# Patient Record
Sex: Female | Born: 1985 | Hispanic: Yes | Marital: Single | Smoking: Never smoker
Health system: Southern US, Community
[De-identification: ages and names within clinical notes are randomized; demographics above are authoritative.]

## PROBLEM LIST (undated history)

## (undated) DIAGNOSIS — B999 Unspecified infectious disease: Secondary | ICD-10-CM

## (undated) HISTORY — PX: NO PAST SURGERIES: SHX2092

---

## 2008-11-28 ENCOUNTER — Inpatient Hospital Stay: Payer: Self-pay | Admitting: Obstetrics and Gynecology

## 2014-06-30 ENCOUNTER — Ambulatory Visit: Payer: Self-pay | Admitting: Advanced Practice Midwife

## 2014-07-21 ENCOUNTER — Ambulatory Visit: Payer: Self-pay | Admitting: Family Medicine

## 2014-11-05 ENCOUNTER — Inpatient Hospital Stay: Payer: Self-pay

## 2015-01-27 NOTE — H&P (Signed)
L&D Evaluation:  History:  HPI 29 y/o G2P1001 @ 38+wks EDC 11/16/14 arrived with c/o regular contractions. Denies leaking fluid, small bloody show, baby is active. Well pregnancy care @ [facility], GBS negative.   Presents with contractions   Patient's Medical History No Chronic Illness   Patient's Surgical History none   Medications Pre Natal Vitamins   Allergies NKDA   Social History none   Family History Non-Contributory   ROS:  ROS All systems were reviewed.  HEENT, CNS, GI, GU, Respiratory, CV, Renal and Musculoskeletal systems were found to be normal.   Exam:  Vital Signs stable   Urine Protein not completed   General no apparent distress   Mental Status clear   Chest clear   Heart normal sinus rhythm   Abdomen gravid, non-tender   Estimated Fetal Weight Average for gestational age   Fetal Position vtx   Fundal Height term   Back no CVAT   Edema no edema   Reflexes 1+   Clonus negative   Pelvic no external lesions, 5cm upon admission progressed to 7cm after AROM clear fluid   Mebranes Ruptured, AROM   Description clear   FHT normal rate with no decels, baseline 120's baseline avg variability with accels   Fetal Heart Rate 136   Ucx regular   Ucx Frequency 23 min   Length of each Contraction 60 seconds   Skin dry   Lymph no lymphadenopathy   Impression:  Impression active labor   Plan:  Plan monitor contractions and for cervical change   Comments Knows what to expect 2nd baby. Breathing well thru uc's. Stadol helpful, declines epidural. Family at bedside, supportive.   Electronic Signatures: Albertina ParrLugiano, Hortensia Duffin B (CNM)  (Signed 17-Feb-16 20:02)  Authored: L&D Evaluation   Last Updated: 17-Feb-16 20:02 by Albertina ParrLugiano, Phoebie Shad B (CNM)

## 2019-03-05 ENCOUNTER — Encounter: Payer: Self-pay | Admitting: Advanced Practice Midwife

## 2019-03-05 DIAGNOSIS — U071 COVID-19: Secondary | ICD-10-CM | POA: Insufficient documentation

## 2019-04-11 LAB — OB RESULTS CONSOLE HEPATITIS B SURFACE ANTIGEN: Hepatitis B Surface Ag: NEGATIVE

## 2019-04-11 LAB — OB RESULTS CONSOLE RUBELLA ANTIBODY, IGM: Rubella: IMMUNE

## 2019-04-11 LAB — OB RESULTS CONSOLE HIV ANTIBODY (ROUTINE TESTING): HIV: NONREACTIVE

## 2019-04-25 ENCOUNTER — Ambulatory Visit (HOSPITAL_COMMUNITY): Payer: Self-pay

## 2019-07-09 LAB — OB RESULTS CONSOLE GBS: GBS: POSITIVE

## 2019-09-04 ENCOUNTER — Other Ambulatory Visit: Payer: Self-pay

## 2019-09-04 ENCOUNTER — Inpatient Hospital Stay (HOSPITAL_COMMUNITY)
Admission: AD | Admit: 2019-09-04 | Discharge: 2019-09-04 | Disposition: A | Discharge status: Home / Self Care | Payer: Self-pay | Attending: Obstetrics & Gynecology | Admitting: Obstetrics & Gynecology

## 2019-09-04 ENCOUNTER — Encounter (HOSPITAL_COMMUNITY): Payer: Self-pay | Admitting: *Deleted

## 2019-09-04 DIAGNOSIS — Z3A32 32 weeks gestation of pregnancy: Secondary | ICD-10-CM | POA: Insufficient documentation

## 2019-09-04 DIAGNOSIS — Z841 Family history of disorders of kidney and ureter: Secondary | ICD-10-CM | POA: Insufficient documentation

## 2019-09-04 DIAGNOSIS — O26893 Other specified pregnancy related conditions, third trimester: Secondary | ICD-10-CM

## 2019-09-04 DIAGNOSIS — B029 Zoster without complications: Secondary | ICD-10-CM | POA: Insufficient documentation

## 2019-09-04 DIAGNOSIS — Z833 Family history of diabetes mellitus: Secondary | ICD-10-CM | POA: Insufficient documentation

## 2019-09-04 DIAGNOSIS — O98513 Other viral diseases complicating pregnancy, third trimester: Secondary | ICD-10-CM | POA: Insufficient documentation

## 2019-09-04 HISTORY — DX: Unspecified infectious disease: B99.9

## 2019-09-04 MED ORDER — VALACYCLOVIR HCL 1 G PO TABS: 1000.0000 mg | ORAL_TABLET | Freq: Three times a day (TID) | ORAL | 0 refills | Status: AC

## 2019-09-04 NOTE — MAU Provider Note (Signed)
Chief Complaint: Rash   First Provider Initiated Contact with Patient 09/04/19 1208     *Spanish interpreter at bedside for this visit*  SUBJECTIVE HPI: Amanda Preston is a 33 y.o. G3P2002 at [redacted]w[redacted]d who presents to Maternity Admissions reporting rash.  Symptoms started a week ago. Reports painful rash on left breast, left axilla, and and upper left back. Rash does not itch. Describes as burning. States she doesn't recall having chicken pox as a child and unsure if she ever had the vaccine. Goes to [facility] for prenatal care. Was told to come here for evaluation because there aren't isolation rooms at the office.  No OB complaints.   Location: breast, axilla, back Quality: burning Severity: 8/10 on pain scale Duration: 1 week Timing: constant Modifying factors: worse with touch Associated signs and symptoms: rash  Past Medical History:  Diagnosis Date  . Infection    UTI   OB History  Gravida Para Term Preterm AB Living  3 2 2    0 2  SAB TAB Ectopic Multiple Live Births          2    # Outcome Date GA Lbr Len/2nd Weight Sex Delivery Anes PTL Lv  3 Current           2 Term     F Vag-Spont   LIV  1 Term     F Vag-Spont   LIV   Past Surgical History:  Procedure Laterality Date  . NO PAST SURGERIES     Social History   Socioeconomic History  . Marital status: Single    Spouse name: Not on file  . Number of children: Not on file  . Years of education: Not on file  . Highest education level: Not on file  Occupational History  . Not on file  Tobacco Use  . Smoking status: Never Smoker  . Smokeless tobacco: Never Used  Substance and Sexual Activity  . Alcohol use: Not Currently    Comment: rare  . Drug use: Never  . Sexual activity: Not Currently  Other Topics Concern  . Not on file  Social History Narrative  . Not on file   Social Determinants of Health   Financial Resource Strain:   . Difficulty of Paying Living Expenses: Not on file  Food Insecurity:   .  Worried About Charity fundraiser in the Last Year: Not on file  . Ran Out of Food in the Last Year: Not on file  Transportation Needs:   . Lack of Transportation (Medical): Not on file  . Lack of Transportation (Non-Medical): Not on file  Physical Activity:   . Days of Exercise per Week: Not on file  . Minutes of Exercise per Session: Not on file  Stress:   . Feeling of Stress : Not on file  Social Connections:   . Frequency of Communication with Friends and Family: Not on file  . Frequency of Social Gatherings with Friends and Family: Not on file  . Attends Religious Services: Not on file  . Active Member of Clubs or Organizations: Not on file  . Attends Archivist Meetings: Not on file  . Marital Status: Not on file  Intimate Partner Violence:   . Fear of Current or Ex-Partner: Not on file  . Emotionally Abused: Not on file  . Physically Abused: Not on file  . Sexually Abused: Not on file   Family History  Problem Relation Age of Onset  . Kidney disease  Mother   . Diabetes Mother    No current facility-administered medications on file prior to encounter.   No current outpatient medications on file prior to encounter.   No Known Allergies  I have reviewed patient's Past Medical Hx, Surgical Hx, Family Hx, Social Hx, medications and allergies.   Review of Systems  Constitutional: Negative.   Gastrointestinal: Negative.   Genitourinary: Negative.   Skin: Positive for rash.    OBJECTIVE Patient Vitals for the past 24 hrs:  BP Temp Temp src Pulse Resp SpO2  09/04/19 1324 108/61 -- -- 81 -- --  09/04/19 1157 117/60 98.1 F (36.7 C) Oral 80 16 --  09/04/19 1147 (!) 113/58 -- -- 78 -- 100 %   Constitutional: Well-developed, well-nourished female in no acute distress.  Cardiovascular: normal rate & rhythm, no murmur Respiratory: normal rate and effort. Lung sounds clear throughout GI: Abd soft, non-tender, Pos BS x 4. No guarding or rebound tenderness MS:  Extremities nontender, no edema, normal ROM Neurologic: Alert and oriented x 4.  Skin: 3 large groupings of vesicular lesions located on top of left breast, inferior to left axilla, and just over left scapula. Lesions appear crusted over.     LAB RESULTS No results found for this or any previous visit (from the past 24 hour(s)).  IMAGING No results found.  MAU COURSE Orders Placed This Encounter  Procedures  . Airborne and Contact precautions  . Discharge patient   Meds ordered this encounter  Medications  . valACYclovir (VALTREX) 1000 MG tablet    Sig: Take 1 tablet (1,000 mg total) by mouth 3 (three) times daily for 7 days.    Dispense:  21 tablet    Refill:  0    Order Specific Question:   Supervising Provider    Answer:   Adam Phenix [[id number]]    MDM FHT present via doppler & pt has no OB complaints  Lesions consistent with shingles. Discussed treatment with patient.   ASSESSMENT 1. Herpes zoster without complication   2. [redacted] weeks gestation of pregnancy     PLAN Discharge home in stable condition. Discussed reasons to return to MAU Rx valacyclovir 1 gm TID x 7 days Discussed precautions at home  Follow-up Information    Department, [facility] Follow up.   Contact information: [address] [place] [address] [phone]          Allergies as of 09/04/2019   No Known Allergies     Medication List    TAKE these medications   valACYclovir 1000 MG tablet Commonly known as: Valtrex Take 1 tablet (1,000 mg total) by mouth 3 (three) times daily for 7 days.        Judeth Horn, NP 09/04/2019  3:25 PM

## 2019-09-04 NOTE — Discharge Instructions (Signed)
Culebrilla Shingles  La culebrilla, tambin conocida como herpes zster, es una infeccin que causa una erupcin cutnea dolorosa y ampollas llenas de lquido. La causa un virus. La culebrilla solo se Programmer, applications que:  Han tenido varicela.  Han recibido un medicamento para protegerse de la varicela (han sido vacunadas). La culebrilla es poco frecuente en ese grupo. Cules son las causas? El virus de la varicela zster (VVZ) ocasiona la culebrilla. Este es el mismo virus que causa la varicela. Despus de la exposicin al VVZ, el virus permanece en el cuerpo en un estado inactivo (latente). La culebrilla se desarrolla si el virus se reactiva. Esto puede ocurrir muchos aos despus de la primera exposicin (inicial) al VVZ. No se sabe qu causa la reactivacin de este virus. Qu incrementa el riesgo? Las personas que han tenido varicela o han recibido la vacuna contra la varicela, estn en riesgo de tener culebrilla. La infeccin de la culebrilla es ms frecuente en las personas que:  [place] de 60aos de [place].  Tienen debilitado el sistema que combate las enfermedades (sistemainmunitario), por ejemplo, las personas que tienen: ? VIH. ? Sndrome de inmunodeficiencia adquirida (sida). ? Cncer.  Reciben medicamentos que debilitan el sistema inmunitario, como los medicamentos que se indican en caso de trasplantes.  Est experimentando mucho estrs. Cules son los signos o los sntomas? Los sntomas tempranos de esta afeccin incluyen picazn, hormigueo y Engineer, mining en un rea de la piel. Este dolor se puede describir como ardor, punzante o pulstil. Unos das o semanas despus de que comienzan los primeros sntomas, aparece una erupcin cutnea rojiza y dolorosa. La erupcin generalmente aparece en un lado del cuerpo en un patrn de bandas o semejante a una franja. Con el tiempo, la erupcin cutnea se convierte en ampollas llenas de lquido que se rompen, se transforman en costras  y se secan en el trmino de 2 a 3 semanas. En cualquier momento durante la infeccin, usted tambin puede presentar:  Grant Ruts.  Escalofros.  Dolor de [place].  Malestar estomacal. Cmo se diagnostica? Esta afeccin se diagnostica con un examen cutneo. Es posible que se extraigan muestras de piel o lquido de las ampollas antes de Education officer, environmental un diagnstico. Estas muestras se examinan bajo un microscopio o se envan a un laboratorio para su anlisis. Cmo se trata? La erupcin cutnea puede durar varias semanas. No hay una cura especfica para esta afeccin. Probablemente, su mdico le recetar medicamentos para ayudarlo a Human resources officer, recuperarse ms rpido y Physiological scientist a Air cabin crew. Entre los medicamentos se incluyen los siguientes:  Medicamentos antivirales.  Frmacos antiinflamatorios.  Analgsicos.  Medicamentos para Associate Professor (antihistamnicos). Si la zona afectada est en el rostro, se lo puede derivar a un especialista, como un mdico especialista en ojos (oftalmlogo) o en odos, nariz y Advertising copywriter (otorrinolaringlogo) para ayudarlo a Automotive engineer problemas oculares, dolor crnico o discapacidad. Siga estas indicaciones en su casa: Medicamentos  [employer] de venta libre y los recetados solamente como se lo haya indicado el mdico.  Aplique una crema para calmar la picazn o cremas anestsicas en la zona afectada segn las indicaciones del mdico. Para aliviar la picazn y las molestias   Aplique paos hmedos y fros (compresas fras) sobre la zona de la erupcin cutnea o las ampollas siguiendo las indicaciones del mdico.  [place] baos con agua fra pueden brindar alivio. Pruebe agregar bicarbonato de sodio o avena seca en el agua para reducir la picazn. No se bae con agua caliente.  [place] y la erupcin cutnea  Mantenga la zona de la erupcin cutnea cubierta con una venda floja (vendaje). Use ropa holgada para ayudar a  Best boy provocado por el roce con la erupcin.  Mantenga la erupcin y las ampollas limpias lavando la zona con [place] y agua fra segn las indicaciones del mdico.  Verifique la erupcin cutnea todos los das para detectar signos de infeccin. Est atento a los siguientes signos: ? Aumento del enrojecimiento, la hinchazn o Conservation officer, historic buildings. ? Lquido o sangre. ? Calor. ? Pus o mal olor.  No se rasque en la zona de la erupcin ni se toque las ampollas. Para ayudar a evitar rascarse: ? Tenga las uas siempre cortas y limpias. ? Use guantes o mitones mientras duerme, si no puede dejar de rascarse. Instrucciones generales  Haga reposo como se lo haya indicado el mdico.  Concurra a todas las visitas de seguimiento como se lo haya indicado el mdico. Esto es importante.  Lvese las manos frecuentemente con agua y [place]. Use desinfectante para manos si no dispone de [place] y [place]. Al hacerlo, reduce sus probabilidades de contraer una infeccin bacteriana en la piel.  Antes de que se forme una [employer], la infeccin de la culebrilla puede causar varicela en las personas que nunca la tuvieron o nunca se vacunaron contra la varicela. Para impedir que esto ocurra, evite el contacto con [employer], especialmente: ? Los bebs. ? Las Com[employer]. ? Los nios con eczema. ? Las [employer] con trasplantes. ? [employer] con enfermedades crnicas, como cncer o sndrome de inmunodeficiencia adquirida (SIDA). Comunquese con un mdico si:  El dolor no se [place] con los [employer].  El dolor no mejora despus de que se cura la erupcin cutnea.  Tiene signos de infeccin en la zona de la erupcin cutnea, tales como los siguientes: ? Aumento del enrojecimiento, la hinchazn o Conservation officer, historic buildings alrededor de la erupcin. ? Presenta lquido o sangre que supura de la erupcin. ? La zona de la erupcin se siente caliente al tacto. ? Advierte pus o  mal olor que sale de la erupcin. Solicite ayuda de inmediato si:  La erupcin cutnea est en el rostro o en la nariz.  Tiene dolor en el rostro, en la zona de los ojos o presenta prdida de sensibilidad en un lado del rostro.  Tiene dificultad para ver.  Siente dolor o zumbido en los odos.  Presenta prdida del gusto.  La afeccin empeora. Resumen  La culebrilla, tambin conocida como herpes zster, es una infeccin que causa una erupcin cutnea dolorosa y ampollas llenas de lquido.  Esta afeccin se diagnostica con un examen cutneo. Es posible que se extraigan y examinen muestras de [place] ampollas antes de Optometrist un diagnstico.  Mantenga la zona de la erupcin cutnea cubierta con una venda floja (vendaje). Use ropa holgada para ayudar a Best boy provocado por el roce con la erupcin.  Antes de que se forme una [employer], la infeccin de la culebrilla puede causar varicela en las personas que nunca la tuvieron o nunca se vacunaron contra la varicela. Esta informacin no tiene Marine scientist el consejo del mdico. Asegrese de hacerle al mdico cualquier pregunta que tenga. Document Released: 06/15/2005 Document Revised: 07/21/2017 Document Reviewed: 07/21/2017 Elsevier Patient Education  2020 [employer].

## 2019-09-04 NOTE — MAU Note (Signed)
Sent in for evaluation ? Shingles called HD yesterday. Rash noted 6 days ago. Has a burning pain with rash. Red raised areas, patches noted on left breast, under arm and around on back-left side.  Does not cross midline, is above bra line. No drainage noted.

## 2019-09-20 NOTE — L&D Delivery Note (Signed)
OB/GYN Faculty Practice Delivery Note  Amanda Preston is a 34 y.o. [id number] s/p VD at [redacted]w[redacted]d. She was admitted for SOL.   ROM: 3h 53m with clear fluid GBS Status: Positive/-- (10/20 0000) Maximum Maternal Temperature: 98.99F  Labor Progress: . Initial SVE: 4/60/-3. Patient received AROM and Pitocin. She then progressed to complete.   Delivery Date/Time: 2/13 @ 2306 Delivery: Amanda Preston bedside as patient feeling pressure and we then began pushing. Head delivered in LOA position. Tight nuchal cord present and reduced. Shoulder and body delivered in usual fashion. Infant with spontaneous cry, placed on mother's abdomen, dried and stimulated. Cord clamped x 2 after 1-minute delay, and cut by FOB. Cord blood drawn. Placenta delivered spontaneously with gentle cord traction. Fundus firm with massage and Pitocin. Labia, perineum, vagina, and cervix inspected inspected with no lacerations.  Baby Weight: pending  Placenta: Sent to L&D Complications: None Lacerations: None EBL: 55 mL Analgesia: Epidural   Infant: APGAR (1 MIN): 9   APGAR (5 MINS): 9  APGAR (10 MINS):     Amanda Birkenhead, MD [facility] Family Medicine Fellow, [facility] for [facility], [facility] 11/02/2019, 11:17 PM

## 2019-10-29 ENCOUNTER — Telehealth (HOSPITAL_COMMUNITY): Payer: Self-pay | Admitting: *Deleted

## 2019-10-29 NOTE — Telephone Encounter (Signed)
Preadmission screen  

## 2019-10-30 ENCOUNTER — Other Ambulatory Visit: Payer: Self-pay | Admitting: Advanced Practice Midwife

## 2019-11-01 ENCOUNTER — Other Ambulatory Visit (HOSPITAL_COMMUNITY): Payer: Self-pay

## 2019-11-01 ENCOUNTER — Telehealth (HOSPITAL_COMMUNITY): Payer: Self-pay | Admitting: *Deleted

## 2019-11-01 ENCOUNTER — Encounter (HOSPITAL_COMMUNITY): Payer: Self-pay | Admitting: *Deleted

## 2019-11-01 NOTE — Telephone Encounter (Signed)
[id number] interpreter number Preadmission screen

## 2019-11-02 ENCOUNTER — Other Ambulatory Visit: Payer: Self-pay

## 2019-11-02 ENCOUNTER — Encounter (HOSPITAL_COMMUNITY): Payer: Self-pay | Admitting: Obstetrics and Gynecology

## 2019-11-02 ENCOUNTER — Inpatient Hospital Stay (HOSPITAL_COMMUNITY): Payer: Medicaid Other | Admitting: Anesthesiology

## 2019-11-02 ENCOUNTER — Inpatient Hospital Stay (HOSPITAL_COMMUNITY)
Admission: AD | Admit: 2019-11-02 | Discharge: 2019-11-04 | DRG: 807 | Disposition: A | Discharge status: Home / Self Care | Payer: Medicaid Other | Attending: Obstetrics and Gynecology | Admitting: Obstetrics and Gynecology

## 2019-11-02 DIAGNOSIS — O480 Post-term pregnancy: Principal | ICD-10-CM | POA: Diagnosis present

## 2019-11-02 DIAGNOSIS — O99824 Streptococcus B carrier state complicating childbirth: Secondary | ICD-10-CM | POA: Diagnosis present

## 2019-11-02 DIAGNOSIS — Z20822 Contact with and (suspected) exposure to covid-19: Secondary | ICD-10-CM | POA: Diagnosis present

## 2019-11-02 DIAGNOSIS — B951 Streptococcus, group B, as the cause of diseases classified elsewhere: Secondary | ICD-10-CM

## 2019-11-02 DIAGNOSIS — Z3A41 41 weeks gestation of pregnancy: Secondary | ICD-10-CM

## 2019-11-02 LAB — ABO/RH: ABO/RH(D): O POS

## 2019-11-02 LAB — TYPE AND SCREEN
ABO/RH(D): O POS
Antibody Screen: NEGATIVE

## 2019-11-02 LAB — CBC
HCT: 37.5 % (ref 36.0–46.0)
Hemoglobin: 12.6 g/dL (ref 12.0–15.0)
MCH: 29.1 pg (ref 26.0–34.0)
MCHC: 33.6 g/dL (ref 30.0–36.0)
MCV: 86.6 fL (ref 80.0–100.0)
Platelets: 204 10*3/uL (ref 150–400)
RBC: 4.33 MIL/uL (ref 3.87–5.11)
RDW: 13.6 % (ref 11.5–15.5)
WBC: 9.1 10*3/uL (ref 4.0–10.5)
nRBC: 0 % (ref 0.0–0.2)

## 2019-11-02 LAB — SARS CORONAVIRUS 2 (TAT 6-24 HRS): SARS Coronavirus 2: NEGATIVE

## 2019-11-02 MED ORDER — DIPHENHYDRAMINE HCL 50 MG/ML IJ SOLN: 12.5000 mg | INTRAMUSCULAR | Status: DC | PRN

## 2019-11-02 MED ORDER — ONDANSETRON HCL 4 MG/2ML IJ SOLN: 4.0000 mg | Freq: Four times a day (QID) | INTRAMUSCULAR | Status: DC | PRN

## 2019-11-02 MED ORDER — PHENYLEPHRINE 40 MCG/ML (10ML) SYRINGE FOR IV PUSH (FOR BLOOD PRESSURE SUPPORT): 80.0000 ug | PREFILLED_SYRINGE | INTRAVENOUS | Status: DC | PRN

## 2019-11-02 MED ORDER — LACTATED RINGERS IV SOLN
500.0000 mL | Freq: Once | INTRAVENOUS | Status: AC
  Administered 2019-11-02: 500 mL via INTRAVENOUS

## 2019-11-02 MED ORDER — TERBUTALINE SULFATE 1 MG/ML IJ SOLN: 0.2500 mg | Freq: Once | INTRAMUSCULAR | Status: DC | PRN

## 2019-11-02 MED ORDER — FENTANYL CITRATE (PF) 100 MCG/2ML IJ SOLN: 100.0000 ug | INTRAMUSCULAR | Status: DC | PRN

## 2019-11-02 MED ORDER — SOD CITRATE-CITRIC ACID 500-334 MG/5ML PO SOLN: 30.0000 mL | ORAL | Status: DC | PRN

## 2019-11-02 MED ORDER — EPHEDRINE 5 MG/ML INJ: 10.0000 mg | INTRAVENOUS | Status: DC | PRN

## 2019-11-02 MED ORDER — SODIUM CHLORIDE 0.9 % IV SOLN
5.0000 10*6.[IU] | Freq: Once | INTRAVENOUS | Status: AC
  Administered 2019-11-02: 5 10*6.[IU] via INTRAVENOUS
  Filled 2019-11-02: qty 5

## 2019-11-02 MED ORDER — OXYCODONE-ACETAMINOPHEN 5-325 MG PO TABS: 1.0000 | ORAL_TABLET | ORAL | Status: DC | PRN

## 2019-11-02 MED ORDER — PENICILLIN G POT IN DEXTROSE 60000 UNIT/ML IV SOLN
3.0000 10*6.[IU] | INTRAVENOUS | Status: DC
  Administered 2019-11-02 (×2): 3 10*6.[IU] via INTRAVENOUS
  Filled 2019-11-02 (×4): qty 50

## 2019-11-02 MED ORDER — ACETAMINOPHEN 325 MG PO TABS: 650.0000 mg | ORAL_TABLET | ORAL | Status: DC | PRN

## 2019-11-02 MED ORDER — LACTATED RINGERS IV SOLN
500.0000 mL | INTRAVENOUS | Status: DC | PRN
  Administered 2019-11-02 (×2): 500 mL via INTRAVENOUS

## 2019-11-02 MED ORDER — FENTANYL-BUPIVACAINE-NACL 0.5-0.125-0.9 MG/250ML-% EP SOLN
12.0000 mL/h | EPIDURAL | Status: DC | PRN
  Filled 2019-11-02: qty 250

## 2019-11-02 MED ORDER — LACTATED RINGERS IV SOLN: INTRAVENOUS | Status: DC

## 2019-11-02 MED ORDER — OXYCODONE-ACETAMINOPHEN 5-325 MG PO TABS: 2.0000 | ORAL_TABLET | ORAL | Status: DC | PRN

## 2019-11-02 MED ORDER — OXYTOCIN 40 UNITS IN NORMAL SALINE INFUSION - SIMPLE MED
1.0000 m[IU]/min | INTRAVENOUS | Status: DC
  Administered 2019-11-02: 21:00:00 2 m[IU]/min via INTRAVENOUS

## 2019-11-02 MED ORDER — OXYTOCIN BOLUS FROM INFUSION
500.0000 mL | Freq: Once | INTRAVENOUS | Status: AC
  Administered 2019-11-02: 500 mL via INTRAVENOUS

## 2019-11-02 MED ORDER — MISOPROSTOL 25 MCG QUARTER TABLET
25.0000 ug | ORAL_TABLET | ORAL | Status: DC | PRN
  Filled 2019-11-02: qty 1

## 2019-11-02 MED ORDER — OXYTOCIN 40 UNITS IN NORMAL SALINE INFUSION - SIMPLE MED
2.5000 [IU]/h | INTRAVENOUS | Status: DC
  Filled 2019-11-02: qty 1000

## 2019-11-02 MED ORDER — LIDOCAINE HCL (PF) 1 % IJ SOLN: 30.0000 mL | INTRAMUSCULAR | Status: DC | PRN

## 2019-11-02 NOTE — H&P (Addendum)
OBSTETRIC ADMISSION HISTORY AND PHYSICAL  Amanda Preston is a 34 y.o. female [phone] with IUP at [redacted]w[redacted]d by  presenting for IOL. She reports +FMs, No LOF, no VB, no blurry vision, headaches or peripheral edema, and RUQ pain.  Is having pelvic pain that lasts 1 minutes and comes every 10-20 minutes.  She plans on breast feeding, and will get the nexplanon for birth control. She received her prenatal care at [facility]   Dating: By Ultrasound of unknown date --->  Estimated Date of Delivery: 10/26/19  Sono:   04/25/2019- Estimated gestational age, 70wk4d, with no other relevant information.   Prenatal History/Complications: GBS positive Low lying placenta, resolved on repeat US  Past Medical History: Past Medical History:  Diagnosis Date  . Infection    UTI    Past Surgical History: Past Surgical History:  Procedure Laterality Date  . NO PAST SURGERIES      Obstetrical History: OB History    Gravida  3   Para  2   Term  2   Preterm      AB  0   Living  2     SAB      TAB      Ectopic      Multiple      Live Births  2           Social History Social History   Socioeconomic History  . Marital status: Single    Spouse name: Not on file  . Number of children: Not on file  . Years of education: Not on file  . Highest education level: Not on file  Occupational History  . Not on file  Tobacco Use  . Smoking status: Never Smoker  . Smokeless tobacco: Never Used  Substance and Sexual Activity  . Alcohol use: Not Currently    Comment: rare  . Drug use: Never  . Sexual activity: Not Currently  Other Topics Concern  . Not on file  Social History Narrative  . Not on file   Social Determinants of Health   Financial Resource Strain:   . Difficulty of Paying Living Expenses: Not on file  Food Insecurity:   . Worried About Programme researcher, broadcasting/film/video in the Last Year: Not on file  . Ran Out of Food in the Last Year: Not on file  Transportation Needs:   . Lack  of Transportation (Medical): Not on file  . Lack of Transportation (Non-Medical): Not on file  Physical Activity:   . Days of Exercise per Week: Not on file  . Minutes of Exercise per Session: Not on file  Stress:   . Feeling of Stress : Not on file  Social Connections:   . Frequency of Communication with Friends and Family: Not on file  . Frequency of Social Gatherings with Friends and Family: Not on file  . Attends Religious Services: Not on file  . Active Member of Clubs or Organizations: Not on file  . Attends Banker Meetings: Not on file  . Marital Status: Not on file    Family History: Family History  Problem Relation Age of Onset  . Kidney disease Mother   . Diabetes Mother   . Hypertension Sister     Allergies: No Known Allergies  No medications prior to admission.     Review of Systems   All systems reviewed and negative except as stated in HPI  Blood pressure 132/77, pulse 84, resp. rate 18, SpO2 99 %.  General appearance: alert and cooperative Lungs: clear to auscultation bilaterally Heart: regular rate and rhythm Abdomen: soft, non-tender; bowel sounds normal Pelvic: 4/70/-2 Extremities: Homans sign is neg ative, no sign of DVT Presentation: cephalic Fetal monitoringBaseline: 125 bpm Cat 1, with moderate variability, accelerations with return to baseline and random late decelerations.  Uterine activity None Dilation: 4 Effacement (%): 60, 70 Exam by:: Fredda Hammed RN   Prenatal labs: ABO, Rh:  O Negative Antibody:  Negative Rubella:  Immune RPR:    Negative HBsAg:   Non-Reactive HIV:   Non-Reactive GBS:   Urine Culture positive (10/20) 1 hr Glucola 52 Genetic screening  Negative Anatomy US   Prenatal Transfer Tool  Maternal Diabetes: No Genetic Screening: Normal Maternal Ultrasounds/Referrals: Normal Fetal Ultrasounds or other Referrals:  Fetal echo Maternal Substance Abuse:  No Significant Maternal Medications:   None Significant Maternal Lab Results: Group B Strep positive  No results found for this or any previous visit (from the past 24 hour(s)).  Patient Active Problem List   Diagnosis Date Noted  . Post term pregnancy at [redacted] weeks gestation 11/02/2019  . COVID-19 03/05/2019    Assessment/Plan:  Amanda Preston is a 34 y.o. G3P2002 at [redacted]w[redacted]d here for IOL.   #Labor: Plan to manage expectantly until adequate GBS coverage, then augment with pit. #Pain: PRN pain opioids and epidural as needed.  #FWB: Cat 1 #ID:  GBS positive, treated with penicillin V #MOF: Breast. #MOC:Nexplanon at outside clinic. #Circ: No  Trinna Post, Medical Student  11/02/2019, 12:06 PM  I confirm that I have verified the information documented in the medical student's note and that I have also personally reperformed the history, physical exam and all medical decision making activities of this service and have verified that all service and findings are accurately documented in this student's note.   Wende Mott, [place] 11/02/2019 4:35 PM

## 2019-11-02 NOTE — Progress Notes (Signed)
Spanish interpreter [phone] used for delivery and recovery  Lenox Ponds, RN

## 2019-11-02 NOTE — Anesthesia Procedure Notes (Signed)
Epidural Patient location during procedure: OB Start time: 11/02/2019 6:42 PM End time: 11/02/2019 7:03 PM  Staffing Anesthesiologist: Trevor Iha, MD Performed: anesthesiologist   Preanesthetic Checklist Completed: patient identified, IV checked, site marked, risks and benefits discussed, surgical consent, monitors and equipment checked, pre-op evaluation and timeout performed  Epidural Patient position: sitting Prep: DuraPrep and site prepped and draped Patient monitoring: continuous pulse ox and blood pressure Approach: midline Location: L3-L4 Injection technique: LOR air  Needle:  Needle type: Tuohy  Needle gauge: 17 G Needle length: 9 cm and 9 Needle insertion depth: 6 cm Catheter type: closed end flexible Catheter size: 19 Gauge Catheter at skin depth: 12 cm Test dose: negative  Assessment Events: blood not aspirated, injection not painful, no injection resistance, no paresthesia and negative IV test  Additional Notes Patient identified. Risks/Benefits/Options discussed with patient including but not limited to bleeding, infection, nerve damage, paralysis, failed block, incomplete pain control, headache, blood pressure changes, nausea, vomiting, reactions to medication both or allergic, itching and postpartum back pain. Confirmed with bedside nurse the patient's most recent platelet count. Confirmed with patient that they are not currently taking any anticoagulation, have any bleeding history or any family history of bleeding disorders. Patient expressed understanding and wished to proceed. All questions were answered. Sterile technique was used throughout the entire procedure. Please see nursing notes for vital signs. Test dose was given through epidural needle and negative prior to continuing to dose epidural or start infusion. Warning signs of high block given to the patient including shortness of breath, tingling/numbness in hands, complete motor block, or any  concerning symptoms with instructions to call for help. Patient was given instructions on fall risk and not to get out of bed. All questions and concerns addressed with instructions to call with any issues. 1 Attempt (S) . Patient tolerated procedure well.

## 2019-11-02 NOTE — Anesthesia Preprocedure Evaluation (Signed)
Anesthesia Evaluation  Patient identified by MRN, date of birth, ID band Patient awake    Reviewed: Allergy & Precautions, NPO status , Patient's Chart, lab work & pertinent test results  Airway Mallampati: II  TM Distance: >3 FB Neck ROM: Full    Dental no notable dental hx. (+) Teeth Intact   Pulmonary neg pulmonary ROS,    Pulmonary exam normal breath sounds clear to auscultation       Cardiovascular Exercise Tolerance: Good negative cardio ROS Normal cardiovascular exam Rhythm:Regular Rate:Normal     Neuro/Psych negative neurological ROS  negative psych ROS   GI/Hepatic negative GI ROS, Neg liver ROS,   Endo/Other  negative endocrine ROS  Renal/GU negative Renal ROS     Musculoskeletal   Abdominal (+) + obese,   Peds  Hematology Hgb 12.6 Plt 204   Anesthesia Other Findings   Reproductive/Obstetrics (+) Pregnancy                             Anesthesia Physical Anesthesia Plan  ASA: III  Anesthesia Plan: Epidural   Post-op Pain Management:    Induction:   PONV Risk Score and Plan:   Airway Management Planned:   Additional Equipment:   Intra-op Plan:   Post-operative Plan:   Informed Consent: I have reviewed the patients History and Physical, chart, labs and discussed the procedure including the risks, benefits and alternatives for the proposed anesthesia with the patient or authorized representative who has indicated his/her understanding and acceptance.       Plan Discussed with:   Anesthesia Plan Comments: (41 wk G3P2 for LEA)        Anesthesia Quick Evaluation

## 2019-11-02 NOTE — Progress Notes (Signed)
Spanish interpreter [phone] used for shift change introduction, plan of care with Antony Odea, CNM at bedside  Lenox Ponds, RN

## 2019-11-02 NOTE — Progress Notes (Signed)
CNM at bedside due to FHR deceleration. Patient flat on back for foley placement post epidural. Cervix unchanged and FSE placed. Position changed to right lateral and resolution of FHR deceleration. Will continue to monitor.   Rolm Bookbinder, CNM 11/02/19 7:39 PM

## 2019-11-02 NOTE — Progress Notes (Signed)
Labor Progress Note Amanda Preston is a 34 y.o. G3P2002 at [redacted]w[redacted]d presented for SOL  S:  Patient comfortable with epidural  O:  BP 139/79   Pulse 79   Temp 98.1 F (36.7 C) (Oral)   Resp 16   Ht 5\' 3"  (1.6 m)   Wt 103 kg   SpO2 97%   BMI 40.21 kg/m   Fetal Tracing:  Baseline: 130 Variability: moderate Accels: 15x15 Decels: early  Toco: 5-7   CVE: Dilation: 6 Effacement (%): 100 Cervical Position: Middle Station: 0 Presentation: Vertex Exam by:: [email or web address], CNM   A&P: 34 y.o. G3P2002 [redacted]w[redacted]d SOL #Labor: Progressing well. Discussed with patient risks and benefits of AROM for augmentation of labor. Patient agreeable to plan of care. AROM with small amount of clear fluid. Patient and FHR tolerated procedure well. Will continue to manage expectantly and anticipate VD soon #Pain: epidural #FWB: Cat 1 #GBS positive  [redacted]w[redacted]d, CNM 7:27 PM

## 2019-11-02 NOTE — Progress Notes (Signed)
Labor Progress Note Kirandeep Fariss de Earnest Conroy is a 34 y.o. G3P2002 at [redacted]w[redacted]d presented for SOL. S: Feeling pressure with ctx.   O:  BP 138/86   Pulse 83   Temp 98.1 F (36.7 C) (Oral)   Resp 16   Ht 5\' 3"  (1.6 m)   Wt 103 kg   SpO2 96%   BMI 40.21 kg/m  EFM: 135, moderate variability, pos accels, early and possibly prolonged decels after ctx, reactive TOCO: q4-56m  CVE: Dilation: 6.5 Effacement (%): 90 Cervical Position: Middle Station: 0 Presentation: Vertex Exam by:: Lameeka Schleifer, MD   A&P: 34 y.o. 34 [redacted]w[redacted]d here for SOL. #Labor: S/p AROM. BSUS done and confirmed anterior placenta prior to IUPC placement as patient without cervical change. FSE placed again but fell off for a second time shortly after placement. Will monitor MVU's and start Pit if indicated and now will be better able to assess decels with IUPC in place. Anticipate SVD. #Pain: epidural #FWB: Cat II #GBS positive  [redacted]w[redacted]d, MD 8:50 PM

## 2019-11-02 NOTE — MAU Note (Signed)
Pt reports to mau with c/o ctx q 10-20 minutes since waking up this morning.  Pt also reports some spotting when she wipes.  Pt denies lof and reports good fetal movement.

## 2019-11-02 NOTE — Discharge Summary (Signed)
Postpartum Discharge Summary     Patient Name: Amanda Preston DOB: 14-Jul-1986 MRN: [id number]  Date of admission: 11/02/2019 Delivering Provider: Chauncey Mann   Date of discharge: 11/04/2019  Admitting diagnosis: Post term pregnancy at [redacted] weeks gestation [O48.0, Z3A.41] Intrauterine pregnancy: [redacted]w[redacted]d    Secondary diagnosis:  Active Problems:   Post term pregnancy at [redacted] weeks gestation   Positive GBS test  Additional problems: None     Discharge diagnosis: Term Pregnancy Delivered                                                                                                Post partum procedures:None  Augmentation: AROM and Pitocin  Complications: None  Hospital course:  Onset of Labor With Vaginal Delivery     34y.o. yo [id number] admitted in Latent Labor on 11/02/2019. Patient had an uncomplicated labor course as follows: nitial SVE: 4/60/-3. Patient received AROM and Pitocin. She then progressed to complete.   Membrane Rupture Time/Date: 7:19 PM ,11/02/2019   Intrapartum Procedures: Episiotomy: None [1]                                         Lacerations:  None [1]  Patient had a delivery of a Viable infant. 11/02/2019  Information for the patient's newborn:  LoJenise, Iannelli0[[id number]]Delivery Method: Vaginal, Spontaneous(Filed from Delivery Summary)     Pateint had an uncomplicated postpartum course. She desires Nexplanon at HD. She is ambulating, tolerating a regular diet, passing flatus, and urinating well. Patient is discharged home in stable condition on 11/04/19.  Delivery time: 11:06 PM    Magnesium Sulfate received: No BMZ received: No Rhophylac:No MMR:No Transfusion:No  Physical exam  Vitals:   11/03/19 0546 11/03/19 1020 11/03/19 1425 11/03/19 2138  BP: 133/78 119/84 114/70 118/75  Pulse: 78 75 72 70  Resp:  '16 16 17  ' Temp: 98.4 F (36.9 C) 98.1 F (36.7 C) 97.6 F (36.4 C) 98 F (36.7 C)  TempSrc: Oral Oral  Oral Oral  SpO2:    99%  Weight:      Height:       General: alert, cooperative and no distress Lochia: appropriate Uterine Fundus: firm Incision: N/A DVT Evaluation: No evidence of DVT seen on physical exam. Labs: Lab Results  Component Value Date   WBC 14.0 (H) 11/03/2019   HGB 11.7 (L) 11/03/2019   HCT 35.0 (L) 11/03/2019   MCV 87.1 11/03/2019   PLT 199 11/03/2019   No flowsheet data found. Edinburgh Score: Edinburgh Postnatal Depression Scale Screening Tool 11/03/2019  I have been able to laugh and see the funny side of things. 0  I have looked forward with enjoyment to things. 0  I have blamed myself unnecessarily when things went wrong. 0  I have been anxious or worried for no good reason. 0  I have felt scared or panicky for no good reason. 0  Things have been getting  on top of me. 0  I have been so unhappy that I have had difficulty sleeping. 0  I have felt sad or miserable. 0  I have been so unhappy that I have been crying. 0  The thought of harming myself has occurred to me. 0  Edinburgh Postnatal Depression Scale Total 0    Discharge instruction: per After Visit Summary and "Baby and Me Booklet".  After visit meds:  Allergies as of 11/04/2019   No Known Allergies     Medication List    TAKE these medications   acetaminophen 325 MG tablet Commonly known as: Tylenol Take 2 tablets (650 mg total) by mouth every 6 (six) hours as needed (for pain scale < 4).   ibuprofen 600 MG tablet Commonly known as: ADVIL Take 1 tablet (600 mg total) by mouth every 6 (six) hours.   PrePLUS 27-1 MG Tabs Take 1 tablet by mouth daily.   senna-docusate 8.6-50 MG tablet Commonly known as: Senokot-S Take 2 tablets by mouth daily. Start taking on: November 05, 2019       Diet: routine diet  Activity: Advance as tolerated. Pelvic rest for 6 weeks.   Outpatient follow up:4 weeks Follow up Appt:No future appointments. Follow up Visit:   Patient to schedule appt  with HD.     Newborn Data: Live born female  Birth Weight: 3490g  APGAR: 71, 9  Newborn Delivery   Birth date/time: 11/02/2019 23:06:00 Delivery type: Vaginal, Spontaneous      Baby Feeding: Bottle and Breast Disposition:home with mother   11/04/2019 Chauncey Mann, MD

## 2019-11-02 NOTE — Progress Notes (Signed)
Patient making cervical change on her own. Offered patient expectant management vs. Pitocin augmentation. Patient desires expectant management and to be rechecked around 2100. Desires augmentation then if unchanged.  Dilation: 5 Effacement (%): 100 Cervical Position: Middle Station: -2 Presentation: Vertex Exam by:: Druscilla Brownie, CNM   Rolm Bookbinder, CNM 11/02/19 5:14 PM

## 2019-11-03 ENCOUNTER — Inpatient Hospital Stay (HOSPITAL_COMMUNITY): Admission: AD | Admit: 2019-11-03 | Payer: Self-pay | Source: Home / Self Care | Admitting: Family Medicine

## 2019-11-03 ENCOUNTER — Encounter (HOSPITAL_COMMUNITY): Payer: Self-pay | Admitting: Obstetrics & Gynecology

## 2019-11-03 ENCOUNTER — Inpatient Hospital Stay (HOSPITAL_COMMUNITY): Payer: Self-pay

## 2019-11-03 LAB — CBC
HCT: 35 % — ABNORMAL LOW (ref 36.0–46.0)
Hemoglobin: 11.7 g/dL — ABNORMAL LOW (ref 12.0–15.0)
MCH: 29.1 pg (ref 26.0–34.0)
MCHC: 33.4 g/dL (ref 30.0–36.0)
MCV: 87.1 fL (ref 80.0–100.0)
Platelets: 199 10*3/uL (ref 150–400)
RBC: 4.02 MIL/uL (ref 3.87–5.11)
RDW: 13.9 % (ref 11.5–15.5)
WBC: 14 10*3/uL — ABNORMAL HIGH (ref 4.0–10.5)
nRBC: 0 % (ref 0.0–0.2)

## 2019-11-03 LAB — PROTEIN / CREATININE RATIO, URINE
Creatinine, Urine: 121.82 mg/dL
Protein Creatinine Ratio: 0.14 mg/mg{Cre} (ref 0.00–0.15)
Total Protein, Urine: 17 mg/dL

## 2019-11-03 LAB — RPR: RPR Ser Ql: NONREACTIVE

## 2019-11-03 MED ORDER — SIMETHICONE 80 MG PO CHEW: 80.0000 mg | CHEWABLE_TABLET | ORAL | Status: DC | PRN

## 2019-11-03 MED ORDER — WITCH HAZEL-GLYCERIN EX PADS: 1.0000 "application " | MEDICATED_PAD | CUTANEOUS | Status: DC | PRN

## 2019-11-03 MED ORDER — ZOLPIDEM TARTRATE 5 MG PO TABS: 5.0000 mg | ORAL_TABLET | Freq: Every evening | ORAL | Status: DC | PRN

## 2019-11-03 MED ORDER — DIBUCAINE (PERIANAL) 1 % EX OINT: 1.0000 "application " | TOPICAL_OINTMENT | CUTANEOUS | Status: DC | PRN

## 2019-11-03 MED ORDER — ACETAMINOPHEN 325 MG PO TABS: 650.0000 mg | ORAL_TABLET | ORAL | Status: DC | PRN

## 2019-11-03 MED ORDER — PRENATAL MULTIVITAMIN CH: 1.0000 | ORAL_TABLET | Freq: Every day | ORAL | Status: DC

## 2019-11-03 MED ORDER — ACETAMINOPHEN 325 MG PO TABS: 650.0000 mg | ORAL_TABLET | Freq: Four times a day (QID) | ORAL | Status: DC | PRN

## 2019-11-03 MED ORDER — COCONUT OIL OIL: 1.0000 "application " | TOPICAL_OIL | Status: DC | PRN

## 2019-11-03 MED ORDER — ONDANSETRON HCL 4 MG/2ML IJ SOLN: 4.0000 mg | INTRAMUSCULAR | Status: DC | PRN

## 2019-11-03 MED ORDER — SENNOSIDES-DOCUSATE SODIUM 8.6-50 MG PO TABS: 2.0000 | ORAL_TABLET | ORAL | Status: DC

## 2019-11-03 MED ORDER — TETANUS-DIPHTH-ACELL PERTUSSIS 5-2.5-18.5 LF-MCG/0.5 IM SUSP: 0.5000 mL | Freq: Once | INTRAMUSCULAR | Status: DC

## 2019-11-03 MED ORDER — IBUPROFEN 600 MG PO TABS
600.0000 mg | ORAL_TABLET | Freq: Three times a day (TID) | ORAL | Status: DC | PRN
  Administered 2019-11-03: 600 mg via ORAL

## 2019-11-03 MED ORDER — DIPHENHYDRAMINE HCL 25 MG PO CAPS: 25.0000 mg | ORAL_CAPSULE | Freq: Four times a day (QID) | ORAL | Status: DC | PRN

## 2019-11-03 MED ORDER — MEASLES, MUMPS & RUBELLA VAC IJ SOLR: 0.5000 mL | Freq: Once | INTRAMUSCULAR | Status: DC

## 2019-11-03 MED ORDER — BENZOCAINE-MENTHOL 20-0.5 % EX AERO: 1.0000 "application " | INHALATION_SPRAY | CUTANEOUS | Status: DC | PRN

## 2019-11-03 MED ORDER — PRENATAL MULTIVITAMIN CH
1.0000 | ORAL_TABLET | Freq: Every day | ORAL | Status: DC
  Administered 2019-11-03 – 2019-11-04 (×2): 1 via ORAL
  Filled 2019-11-03 (×2): qty 1

## 2019-11-03 MED ORDER — ONDANSETRON HCL 4 MG PO TABS: 4.0000 mg | ORAL_TABLET | ORAL | Status: DC | PRN

## 2019-11-03 MED ORDER — SENNOSIDES-DOCUSATE SODIUM 8.6-50 MG PO TABS
2.0000 | ORAL_TABLET | ORAL | Status: DC
  Administered 2019-11-04: 2 via ORAL
  Filled 2019-11-03 (×2): qty 2

## 2019-11-03 MED ORDER — IBUPROFEN 600 MG PO TABS
600.0000 mg | ORAL_TABLET | Freq: Four times a day (QID) | ORAL | Status: DC
  Administered 2019-11-03 – 2019-11-04 (×4): 600 mg via ORAL
  Filled 2019-11-03 (×5): qty 1

## 2019-11-03 MED ORDER — ENOXAPARIN SODIUM 60 MG/0.6ML ~~LOC~~ SOLN
0.5000 mg/kg | SUBCUTANEOUS | Status: DC
  Administered 2019-11-03: 18:00:00 50 mg via SUBCUTANEOUS
  Filled 2019-11-03: qty 0.6

## 2019-11-03 NOTE — Progress Notes (Signed)
Post Partum Day 1 Subjective: Patient reports feeling well. She is tolerating PO. She has not yet ambulated. Lochia minimal.  Objective: Blood pressure 136/76, pulse 80, temperature 98.6 F (37 C), temperature source Oral, resp. rate 16, height 5\' 3"  (1.6 m), weight 103 kg, SpO2 96 %, unknown if currently breastfeeding.  Physical Exam:  General: alert, cooperative and appears stated age 35: appropriate Uterine Fundus: firm Incision: NA DVT Evaluation: No evidence of DVT seen on physical exam.  Recent Labs    11/02/19 1226  HGB 12.6  HCT 37.5    Assessment/Plan: Plan for discharge tomorrow  Breast and bottle feeding Desires BTL but no insurance; plans for Nexplanon at HD Vitals stable; few elevated BP's (Pr/Cr ratio ordered); cont to monitor   LOS: 1 day   11/04/19 11/03/2019, 3:31 AM

## 2019-11-03 NOTE — Anesthesia Postprocedure Evaluation (Signed)
Anesthesia Post Note  Patient: Amanda Preston  Procedure(s) Performed: AN AD HOC LABOR EPIDURAL     Patient location during evaluation: Mother Baby Anesthesia Type: Epidural Level of consciousness: awake and alert Pain management: pain level controlled Vital Signs Assessment: post-procedure vital signs reviewed and stable Respiratory status: spontaneous breathing, nonlabored ventilation and respiratory function stable Cardiovascular status: stable Postop Assessment: no headache, no backache, epidural receding, no apparent nausea or vomiting, patient able to bend at knees, adequate PO intake and able to ambulate Anesthetic complications: no    Last Vitals:  Vitals:   11/03/19 0218 11/03/19 0546  BP: 136/76 133/78  Pulse: 80 78  Resp:    Temp: 37 C 36.9 C  SpO2:      Last Pain:  Vitals:   11/03/19 0546  TempSrc: Oral  PainSc:    Pain Goal:                   Laban Emperor

## 2019-11-03 NOTE — Progress Notes (Signed)
Spanish interpreter [phone] used for urethral catheter and finishing recovery/ getting patient up from bed to wheelchair to transport to new unit.   Lenox Ponds, RN

## 2019-11-03 NOTE — Lactation Note (Addendum)
This note was copied from a baby's chart. Lactation Consultation Note  Patient Name: Boy Aleighya Mcanelly Today's Date: 11/03/2019   P3, Baby 11 hours old and in nursery.  Spanish interpreter Wallene Huh present. Mother states she plans to breastfeed and formula feed. Baby has only formula fed since birth x 3.  She breastfed her other children for 3 months and states she had low milk supply but was supplementing from the beginning. Discussed supply and demand and recommend bf before offering formula. Mother states she knows how to hand express. Suggest calling if she needs further assistance. Mom made aware of O/P services, breastfeeding support groups, community resources, and our phone # for post-discharge questions.       Maternal Data    Feeding Feeding Type: Bottle Fed - Formula Nipple Type: Slow - flow  LATCH Score                   Interventions    Lactation Tools Discussed/Used     Consult Status      Hardie Pulley 11/03/2019, 10:53 AM

## 2019-11-04 MED ORDER — IBUPROFEN 600 MG PO TABS: 600.0000 mg | ORAL_TABLET | Freq: Four times a day (QID) | ORAL | 0 refills | Status: DC

## 2019-11-04 MED ORDER — ACETAMINOPHEN 325 MG PO TABS: 650.0000 mg | ORAL_TABLET | Freq: Four times a day (QID) | ORAL | 0 refills | Status: DC | PRN

## 2019-11-04 MED ORDER — PREPLUS 27-1 MG PO TABS: 1.0000 | ORAL_TABLET | Freq: Every day | ORAL | 13 refills | Status: DC

## 2019-11-04 MED ORDER — SENNOSIDES-DOCUSATE SODIUM 8.6-50 MG PO TABS: 2.0000 | ORAL_TABLET | ORAL | 0 refills | Status: DC

## 2019-11-04 NOTE — Lactation Note (Signed)
This note was copied from a baby's chart. Lactation Consultation Note  Patient Name: Amanda Preston APOLI'D Date: 11/04/2019   Baby 36 hours old.  Video interpreter used for Spanish. Mother denies questions or concerns and states bf is going well.  Feed on demand with cues.  Goal 8-12+ times per day after first 24 hrs.  Place baby STS if not cueing.  Reviewed engorgement care and monitoring voids/stools. Mother is breastfeeding and supplementing with formula after as desired.      Maternal Data    Feeding Feeding Type: Breast Fed  LATCH Score                   Interventions    Lactation Tools Discussed/Used     Consult Status      Amanda Preston 11/04/2019, 11:22 AM

## 2021-02-17 ENCOUNTER — Telehealth: Payer: Self-pay

## 2021-02-17 NOTE — Telephone Encounter (Signed)
Copied from CRM [id number]. Topic: Appointment Scheduling - Scheduling Inquiry for Clinic >> Feb 09, 2021  8:38 AM Aretta Nip wrote: Reason for CRM: pt has requested New pt appt and wants to get started on the financial aid with Mikle Bosworth [phone] Appt is not till 8/31 but pt wants to have time to gather all her documents.  Called patient, no answer, and no VM set up. Patient needs to wait to complete her appointment on 05/19/2021 in order to apply for financial. If patient calls back regarding her request let her know she will need to see doctor prior to applying but she can come in at her convenience and pick up an application to work on gathering the documents prior to her appointment in august. At the time of her visit in august we will schedule an appointment for her to meet with Mikle Bosworth.

## 2021-05-17 ENCOUNTER — Telehealth: Payer: Self-pay

## 2021-05-17 NOTE — Telephone Encounter (Signed)
Pt is calling to get 05/19/21 appt rescheduled NPA. Please advise CB- [phone]

## 2021-05-17 NOTE — Telephone Encounter (Signed)
I return Pt call, she call to confirm her appt

## 2021-05-19 ENCOUNTER — Encounter: Payer: Self-pay | Admitting: Family Medicine

## 2021-05-19 ENCOUNTER — Other Ambulatory Visit: Payer: Self-pay

## 2021-05-19 ENCOUNTER — Ambulatory Visit: Payer: Self-pay | Attending: Family Medicine | Admitting: Family Medicine

## 2021-05-19 VITALS — BP 113/70 | HR 79 | Resp 16 | Ht 66.0 in | Wt 162.4 lb

## 2021-05-19 DIAGNOSIS — R202 Paresthesia of skin: Secondary | ICD-10-CM

## 2021-05-19 DIAGNOSIS — M7542 Impingement syndrome of left shoulder: Secondary | ICD-10-CM

## 2021-05-19 DIAGNOSIS — Z131 Encounter for screening for diabetes mellitus: Secondary | ICD-10-CM

## 2021-05-19 LAB — POCT GLYCOSYLATED HEMOGLOBIN (HGB A1C): Hemoglobin A1C: 5.5 % (ref 4.0–5.6)

## 2021-05-19 MED ORDER — GABAPENTIN 300 MG PO CAPS: 300.0000 mg | ORAL_CAPSULE | Freq: Every day | ORAL | 3 refills | Status: DC

## 2021-05-19 MED ORDER — MELOXICAM 7.5 MG PO TABS: 7.5000 mg | ORAL_TABLET | Freq: Every day | ORAL | 1 refills | Status: DC

## 2021-05-19 NOTE — Progress Notes (Signed)
Subjective:  Patient ID: Amanda Preston, female    DOB: 1986-03-15  Age: 35 y.o. MRN: [id number]  CC: New Patient (Initial Visit)   HPI Amanda Preston is a 35 y.o. year old female who presents to establish care.  Interval History: Since 2018 she has had numbness in her left arm and she has to let her arm hang off the bed for relief and extends from her shoulder to hand Sometimes occurs in her R upper extremity. It starts out as pain in her LUE. Previously had difficulty lifting LUE with associated pain but received medications which were beneficial. Past Medical History:  Diagnosis Date   Infection    UTI    Past Surgical History:  Procedure Laterality Date   NO PAST SURGERIES      Family History  Problem Relation Age of Onset   Kidney disease Mother    Diabetes Mother    Hypertension Sister     No Known Allergies  Outpatient Medications Prior to Visit  Medication Sig Dispense Refill   acetaminophen (TYLENOL) 325 MG tablet Take 2 tablets (650 mg total) by mouth every 6 (six) hours as needed (for pain scale < 4). (Patient not taking: Reported on 05/19/2021) 30 tablet 0   ibuprofen (ADVIL) 600 MG tablet Take 1 tablet (600 mg total) by mouth every 6 (six) hours. (Patient not taking: Reported on 05/19/2021) 30 tablet 0   Prenatal Vit-Fe Fumarate-FA (PREPLUS) 27-1 MG TABS Take 1 tablet by mouth daily. (Patient not taking: Reported on 05/19/2021) 30 tablet 13   senna-docusate (SENOKOT-S) 8.6-50 MG tablet Take 2 tablets by mouth daily. (Patient not taking: Reported on 05/19/2021) 30 tablet 0   No facility-administered medications prior to visit.     ROS Review of Systems  Constitutional:  Negative for activity change, appetite change and fatigue.  HENT:  Negative for congestion, sinus pressure and sore throat.   Eyes:  Negative for visual disturbance.  Respiratory:  Negative for cough, chest tightness, shortness of breath and wheezing.   Cardiovascular:   Negative for chest pain and palpitations.  Gastrointestinal:  Negative for abdominal distention, abdominal pain and constipation.  Endocrine: Negative for polydipsia.  Genitourinary:  Negative for dysuria and frequency.  Musculoskeletal:  Negative for arthralgias and back pain.  Skin:  Negative for rash.  Neurological:  Positive for numbness. Negative for tremors and light-headedness.  Hematological:  Does not bruise/bleed easily.  Psychiatric/Behavioral:  Negative for agitation and behavioral problems.    Objective:  BP 113/70   Pulse 79   Resp 16   Ht 5\' 6"  (1.676 m)   Wt 162 lb 6.4 oz (73.7 kg)   SpO2 99%   BMI 26.21 kg/m   BP/Weight 05/19/2021 11/04/2019 11/02/2019  Systolic BP 113 122 -  Diastolic BP 70 83 -  Wt. (Lbs) 162.4 - 227  BMI 26.21 - 40.21      Physical Exam Constitutional:      Appearance: She is well-developed.  Cardiovascular:     Rate and Rhythm: Normal rate.     Heart sounds: Normal heart sounds. No murmur heard. Pulmonary:     Effort: Pulmonary effort is normal.     Breath sounds: Normal breath sounds. No wheezing or rales.  Chest:     Chest wall: No tenderness.  Abdominal:     General: Bowel sounds are normal. There is no distension.     Palpations: Abdomen is soft. There is no mass.  Tenderness: There is no abdominal tenderness.  Musculoskeletal:        General: Normal range of motion.     Right lower leg: No edema.     Left lower leg: No edema.  Neurological:     Mental Status: She is alert and oriented to person, place, and time.  Psychiatric:        Mood and Affect: Mood normal.    No flowsheet data found.  Lipid Panel  No results found for: CHOL, TRIG, HDL, CHOLHDL, VLDL, LDLCALC, LDLDIRECT  CBC    Component Value Date/Time   WBC 14.0 (H) 11/03/2019 0514   RBC 4.02 11/03/2019 0514   HGB 11.7 (L) 11/03/2019 0514   HCT 35.0 (L) 11/03/2019 0514   PLT 199 11/03/2019 0514   MCV 87.1 11/03/2019 0514   MCH 29.1 11/03/2019 0514    MCHC 33.4 11/03/2019 0514   RDW 13.9 11/03/2019 0514    Lab Results  Component Value Date   HGBA1C 5.5 05/19/2021    Assessment & Plan:  1. Impingement syndrome of left shoulder Uncontrolled Placed on meloxicam and gabapentin Referred for PT - meloxicam (MOBIC) 7.5 MG tablet; Take 1 tablet (7.5 mg total) by mouth daily.  Dispense: 30 tablet; Refill: 1 - gabapentin (NEURONTIN) 300 MG capsule; Take 1 capsule (300 mg total) by mouth at bedtime.  Dispense: 30 capsule; Refill: 3 - Basic Metabolic Panel  2. Paresthesia Diabetes screen is negative - Vitamin B12  3.  Screening for diabetes mellitus A1c is 5.5 - POCT glycosylated hemoglobin (Hb A1C)   Meds ordered this encounter  Medications   meloxicam (MOBIC) 7.5 MG tablet    Sig: Take 1 tablet (7.5 mg total) by mouth daily.    Dispense:  30 tablet    Refill:  1   gabapentin (NEURONTIN) 300 MG capsule    Sig: Take 1 capsule (300 mg total) by mouth at bedtime.    Dispense:  30 capsule    Refill:  3    Follow-up: Return in about 3 months (around 08/18/2021) for Follow-up of left shoulder pain.       Hoy Register, MD, FAAFP. [facility] [place], [place] [phone]   05/19/2021, 2:19 PM

## 2021-05-20 LAB — VITAMIN B12: Vitamin B-12: 568 pg/mL (ref 232–1245)

## 2021-05-20 LAB — BASIC METABOLIC PANEL WITH GFR
BUN/Creatinine Ratio: 33 — ABNORMAL HIGH (ref 9–23)
BUN: 16 mg/dL (ref 6–20)
CO2: 25 mmol/L (ref 20–29)
Calcium: 8.8 mg/dL (ref 8.7–10.2)
Chloride: 102 mmol/L (ref 96–106)
Creatinine, Ser: 0.49 mg/dL — ABNORMAL LOW (ref 0.57–1.00)
Glucose: 91 mg/dL (ref 65–99)
Potassium: 4.2 mmol/L (ref 3.5–5.2)
Sodium: 140 mmol/L (ref 134–144)
eGFR: 128 mL/min/1.73

## 2021-05-27 ENCOUNTER — Ambulatory Visit: Payer: Self-pay | Attending: Family Medicine

## 2021-05-27 ENCOUNTER — Other Ambulatory Visit: Payer: Self-pay

## 2021-05-27 ENCOUNTER — Telehealth (INDEPENDENT_AMBULATORY_CARE_PROVIDER_SITE_OTHER): Payer: Self-pay | Admitting: Family Medicine

## 2021-05-27 NOTE — Telephone Encounter (Signed)
Copied from CRM [id number]. Topic: General - Other >> May 26, 2021  4:13 PM Gaetana Michaelis A wrote: Reason for CRM: Patient would like to be contacted regarding recent lab results  The patient will need for assistance with translation and interpretation in Spanish  Please contact further when possible

## 2021-05-28 NOTE — Telephone Encounter (Signed)
Pt was called and informed of lab results. 

## 2021-06-16 ENCOUNTER — Ambulatory Visit: Payer: Self-pay | Admitting: *Deleted

## 2021-06-16 NOTE — Telephone Encounter (Signed)
C/o of continued numbness of both hands. The more she uses them the numbness continues. Seen on 05/19/21 for this problem and prescribed medication which has not helped."Does not think it is inflammation" as she was diagnosed. She has a follow-up appointment in November. Offered her an earlier appointment and provided information for other resource, [facility] in [place]. She would like to see other office at this time. Encouraged patient to follow up with our office as she needed to.    Reason for Disposition  [1] Numbness or tingling in one or both hands AND [2] is a chronic symptom (recurrent or ongoing AND present > 4 weeks)  Answer Assessment - Initial Assessment Questions 1. SYMPTOM: "What is the main symptom you are concerned about?" (e.g., weakness, numbness)     Both hands with numbness 2. ONSET: "When did this start?" (minutes, hours, days; while sleeping)     Has been seen for this problem twice recently 3. LAST NORMAL: "When was the last time you (the patient) were normal (no symptoms)?"     Prior to the last two appointments 4. PATTERN "Does this come and go, or has it been constant since it started?"  "Is it present now?"     Comes and goes everyday now 5. CARDIAC SYMPTOMS: "Have you had any of the following symptoms: chest pain, difficulty breathing, palpitations?"     Arm pain after the numbness 6. NEUROLOGIC SYMPTOMS: "Have you had any of the following symptoms: headache, dizziness, vision loss, double vision, changes in speech, unsteady on your feet?"     None of these 7. OTHER SYMPTOMS: "Do you have any other symptoms?"     no 8. PREGNANCY: "Is there any chance you are pregnant?" "When was your last menstrual period?"     no  Protocols used: Neurologic Deficit-A-AH

## 2021-07-08 ENCOUNTER — Encounter: Payer: Self-pay | Admitting: Physician Assistant

## 2021-07-08 ENCOUNTER — Other Ambulatory Visit: Payer: Self-pay

## 2021-07-08 ENCOUNTER — Ambulatory Visit: Payer: Self-pay | Attending: Family Medicine | Admitting: Physician Assistant

## 2021-07-08 VITALS — BP 114/76 | HR 80 | Ht 66.0 in | Wt 163.5 lb

## 2021-07-08 DIAGNOSIS — M7542 Impingement syndrome of left shoulder: Secondary | ICD-10-CM

## 2021-07-08 DIAGNOSIS — Z789 Other specified health status: Secondary | ICD-10-CM

## 2021-07-08 MED ORDER — GABAPENTIN 300 MG PO CAPS
300.0000 mg | ORAL_CAPSULE | Freq: Every day | ORAL | 3 refills | Status: DC
  Filled 2021-07-08: qty 30, 30d supply, fill #0

## 2021-07-08 NOTE — Progress Notes (Signed)
Patient ID: Amanda Preston, female   DOB: 03/05/1986, 35 y.o.   MRN: [id number]   Levy Wellman, is a 35 y.o. female  LGX:[id number]  CXK:[id number]  DOB - Jan 09, 1986  Chief Complaint  Patient presents with   Numbness    Starts at finger and goes up the arm       Subjective:   Amanda Preston is a 35 y.o. female here today for a follow up visit to recheck L arm and hand.  She continues to have paresthesias and limited ROM w/o pain.  This has been going on since about 2014.  NKI.  Conservative measures have failed to help  No problems updated.  ALLERGIES: No Known Allergies  PAST MEDICAL HISTORY: Past Medical History:  Diagnosis Date   Infection    UTI    MEDICATIONS AT HOME: Prior to Admission medications   Medication Sig Start Date End Date Taking? Authorizing Provider  acetaminophen (TYLENOL) 325 MG tablet Take 2 tablets (650 mg total) by mouth every 6 (six) hours as needed (for pain scale < 4). Patient not taking: Reported on 07/08/2021 11/04/19   Joselyn Arrow, MD  gabapentin (NEURONTIN) 300 MG capsule Take 1 capsule (300 mg total) by mouth at bedtime. 07/08/21   Anders Simmonds, PA-C  ibuprofen (ADVIL) 600 MG tablet Take 1 tablet (600 mg total) by mouth every 6 (six) hours. Patient not taking: Reported on 07/08/2021 11/04/19   Honor Loh, DO  meloxicam (MOBIC) 7.5 MG tablet Take 1 tablet (7.5 mg total) by mouth daily. Patient not taking: Reported on 07/08/2021 05/19/21   Hoy Register, MD  Prenatal Vit-Fe Fumarate-FA (PREPLUS) 27-1 MG TABS Take 1 tablet by mouth daily. Patient not taking: Reported on 07/08/2021 11/04/19   Honor Loh, DO  senna-docusate (SENOKOT-S) 8.6-50 MG tablet Take 2 tablets by mouth daily. Patient not taking: Reported on 07/08/2021 11/05/19   Joselyn Arrow, MD    ROS: Neg HEENT Neg resp Neg cardiac Neg GI Neg GU Neg psych Neg neuro  Objective:   Vitals:   07/08/21 1619  BP: 114/76  Pulse: 80   SpO2: 98%  Weight: 163 lb 8 oz (74.2 kg)  Height: 5\' 6"  (1.676 m)   Exam General appearance : Awake, alert, not in any distress. Speech Clear. Not toxic looking HEENT: Atraumatic and NormocephalicNeck: Supple, no JVD. No cervical lymphadenopathy.  Chest: Good air entry bilaterally, CTAB.  No rales/rhonchi/wheezing CVS: S1 S2 regular, no murmurs.  UE full S&ROM.  DTR=B, normal grip Extremities: B/L Lower Ext shows no edema, both legs are warm to touch Neurology: Awake alert, and oriented X 3, CN II-XII intact, Non focal Skin: No Rash  Data Review Lab Results  Component Value Date   HGBA1C 5.5 05/19/2021    Assessment & Plan   1. Impingement syndrome of left shoulder - Ambulatory referral to Orthopedic Surgery - gabapentin (NEURONTIN) 300 MG capsule; Take 1 capsule (300 mg total) by mouth at bedtime.  Dispense: 30 capsule; Refill: 3  2. Language barrier [employer] interpre[employer] "05/21/2021" used and additional time performing visit was required.     Patient have been counseled extensively about nutrition and exercise. Other issues discussed during this visit include: low cholesterol diet, weight control and daily exercise, foot care, annual eye examinations at Ophthalmology, importance of adherence with medications and regular follow-up. We also discussed long term complications of uncontrolled diabetes and hypertension.   Return if symptoms worsen or fail to improve.  The patient was given clear instructions to go to ER or return to medical center if symptoms don't improve, worsen or new problems develop. The patient verbalized understanding. The patient was told to call to get lab results if they haven't heard anything in the next week.      Georgian Co, PA-C [facility] [place], [place] [phone]   07/08/2021, 4:44 PM

## 2021-07-20 ENCOUNTER — Ambulatory Visit: Payer: Self-pay | Admitting: Orthopaedic Surgery

## 2021-08-03 ENCOUNTER — Ambulatory Visit (INDEPENDENT_AMBULATORY_CARE_PROVIDER_SITE_OTHER): Payer: Self-pay

## 2021-08-03 ENCOUNTER — Ambulatory Visit (INDEPENDENT_AMBULATORY_CARE_PROVIDER_SITE_OTHER): Payer: Self-pay | Admitting: Orthopaedic Surgery

## 2021-08-03 ENCOUNTER — Encounter: Payer: Self-pay | Admitting: Orthopaedic Surgery

## 2021-08-03 ENCOUNTER — Other Ambulatory Visit: Payer: Self-pay

## 2021-08-03 DIAGNOSIS — M542 Cervicalgia: Secondary | ICD-10-CM

## 2021-08-03 DIAGNOSIS — G5603 Carpal tunnel syndrome, bilateral upper limbs: Secondary | ICD-10-CM

## 2021-08-03 NOTE — Progress Notes (Signed)
Office Visit Note   Patient: Amanda Preston           Date of Birth: 23-Apr-1986           MRN: [id number] Visit Date: 08/03/2021              Requested by: Anders Simmonds, PA-C [address] [place],  [place] [address] PCP: Hoy Register, MD   Assessment & Plan: Visit Diagnoses:  1. Bilateral carpal tunnel syndrome   2. Neck pain     Plan: Impression is bilateral hand carpal tunnel syndrome.  At this point, would like to refer her to Dr. Alvester Morin for nerve conduction study/EMG bilateral upper extremities.  In the meantime, we have provided her with removable Velcro splints to wear at night.  She will follow-up with [place] after the nerve conduction study.  Follow-Up Instructions: Return for after NCS.   Orders:  Orders Placed This Encounter  Procedures   XR Shoulder Left   XR Cervical Spine 2 or 3 views   No orders of the defined types were placed in this encounter.     Procedures: No procedures performed   Clinical Data: No additional findings.   Subjective: Chief Complaint  Patient presents with   Left Shoulder - Pain    HPI patient is a pleasant 35 year old right-hand-dominant Spanish-speaking female who is here today with bilateral hand paresthesias left greater than right.  She has been having the symptoms since 2014.  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  Symptoms she has are primarily to the index, long and ring fingers.  Worse at night where she frequently wakes up shaking her hands.  She has not previously worn a splint.  She has been taking meloxicam without relief.  She denies any neck pain.  No previous nerve conduction study.  Review of Systems as detailed in HPI.  All others reviewed and are negative.   Objective: Vital Signs: There were no vitals taken for this visit.  Physical Exam well-developed well-nourished female in no acute distress.  Alert and oriented x3.  Ortho Exam  bilateral hand exam shows a positive Phalen on the left.  Negative Tinel both sides.  No thenar atrophy.  Full grip strength.  She is neurovascular intact distally.  Specialty Comments:  No specialty comments available.  Imaging: XR Cervical Spine 2 or 3 views  Result Date: 08/03/2021 No acute or structural abnormalities  XR Shoulder Left  Result Date: 08/03/2021 No acute or structural abnormalities    PMFS History: Patient Active Problem List   Diagnosis Date Noted   Post term pregnancy at [redacted] weeks gestation 11/02/2019   Positive GBS test 11/02/2019   COVID-19 03/05/2019   Past Medical History:  Diagnosis Date   Infection    UTI    Family History  Problem Relation Age of Onset   Kidney disease Mother    Diabetes Mother    Hypertension Sister     Past Surgical History:  Procedure Laterality Date   NO PAST SURGERIES     Social History   Occupational History   Not on file  Tobacco Use   Smoking status: Never   Smokeless tobacco: Never  Vaping Use   Vaping Use: Never used  Substance and Sexual Activity   Alcohol use: Not Currently    Comment: rare   Drug use: Never   Sexual activity: Not Currently

## 2021-08-17 ENCOUNTER — Ambulatory Visit: Payer: Self-pay | Admitting: Family Medicine

## 2021-09-01 ENCOUNTER — Telehealth: Payer: Self-pay | Admitting: Family Medicine

## 2021-09-01 NOTE — Telephone Encounter (Signed)
Pt called to report that she has a medication for her hand pain that is not strong enough, she does not know the name of the medication. She says it was a pill she took 30 minutes before bed and then another one during the day.

## 2021-09-02 ENCOUNTER — Telehealth: Payer: Self-pay | Admitting: Family Medicine

## 2021-09-02 NOTE — Telephone Encounter (Signed)
Copied from CRM [id number]. Topic: General - Call Back - No Documentation >> Sep 01, 2021  3:49 PM Randol Kern wrote: Reason for CRM: Pt called and reported that she has yet to hear from the results of her hand examination at [facility]. She is asking for PCP to contact [facility] for results, then call her with a translation.   Best contact: [phone]

## 2021-09-02 NOTE — Telephone Encounter (Signed)
Pt is requesting results from X-rays ordered by [facility].

## 2021-09-02 NOTE — Telephone Encounter (Signed)
Interpreter assistance provided by [employer], [place], [place] [id number].  Scheduled appt. With Provider Tanda Rockers.  On Friday 09/03/2021 at 0910

## 2021-09-02 NOTE — Telephone Encounter (Signed)
Please provide her with the number for [facility] so this can be discussed with her.  Looks like this was an internal imaging done by the office.

## 2021-09-03 ENCOUNTER — Telehealth: Payer: Self-pay | Admitting: Radiology

## 2021-09-03 ENCOUNTER — Encounter: Payer: Self-pay | Admitting: Nurse Practitioner

## 2021-09-03 ENCOUNTER — Other Ambulatory Visit: Payer: Self-pay

## 2021-09-03 ENCOUNTER — Telehealth (INDEPENDENT_AMBULATORY_CARE_PROVIDER_SITE_OTHER): Payer: Self-pay | Admitting: Nurse Practitioner

## 2021-09-03 DIAGNOSIS — G5603 Carpal tunnel syndrome, bilateral upper limbs: Secondary | ICD-10-CM

## 2021-09-03 DIAGNOSIS — M79642 Pain in left hand: Secondary | ICD-10-CM

## 2021-09-03 DIAGNOSIS — Z789 Other specified health status: Secondary | ICD-10-CM

## 2021-09-03 NOTE — Progress Notes (Signed)
Virtual Visit via Telephone Note  I connected with Amanda Preston on 09/03/21 at  9:00 AM EST by telephone and verified that I am speaking with the correct person using two identifiers.  Location: Patient: home Provider: office   I discussed the limitations, risks, security and privacy concerns of performing an evaluation and management service by telephone and the availability of in person appointments. I also discussed with the patient that there may be a patient responsible charge related to this service. The patient expressed understanding and agreed to proceed.   History of Present Illness:  Patient presents today with hand pain.  This is a televisit and Spanish interpreter was used for this visit.  Patient states that she has been having bilateral hand pain but her left hand hurts more than the right.  She has been referred to orthopedics and was evaluated for this issue along with some shoulder pain as well.  Patient does have a language barrier and has not heard back from the orthopedic office says she was calling today to request that we reach out to their office for further instructions.  We discussed that it was noted in her last office visit by Dr. Roda Shutters that he planned to refer her to Dr. Alvester Morin for a nerve conduction study of bilateral upper extremities and he wanted to follow-up with her after she had the nerve conduction study completed.  She states that she has not been contacted by Dr. Edina Blas office yet.  We discussed that I will reach out to the orthopedic office to find out the status on this referral.  Patient was given Velcro splints to wear at night at her last Ortho visit and she can continue this as directed. Denies f/c/s, n/v/d, hemoptysis, PND, chest pain or edema.      Observations/Objective:  Vitals with BMI 07/08/2021 05/19/2021 11/04/2019  Height 5\' 6"  5\' 6"  -  Weight 163 lbs 8 oz 162 lbs 6 oz -  BMI 26.4 26.22 -  Systolic 114 113  Diastolic 76 70 83   Pulse 80 79 63     Assessment and Plan:  Left hand pain:  Please continue wrist splints  Will reach out to ortho about nerve conduction study  Follow up:  Follow up if needed    I discussed the assessment and treatment plan with the patient. The patient was provided an opportunity to ask questions and all were answered. The patient agreed with the plan and demonstrated an understanding of the instructions.   The patient was advised to call back or seek an in-person evaluation if the symptoms worsen or if the condition fails to improve as anticipated.  I provided 23 minutes of non-face-to-face time during this encounter.   , NP

## 2021-09-03 NOTE — Telephone Encounter (Signed)
Pt states that she has been informed of results.

## 2021-09-03 NOTE — Patient Instructions (Addendum)
Left hand pain:  Please continue wrist splints  Will reach out to ortho about nerve conduction study  Follow up:  Follow up if needed

## 2021-09-03 NOTE — Telephone Encounter (Signed)
Received call from Angus Seller, NP who had a phone encounter with patient today. While on the phone, patient noted that she had seen Dr. Roda Shutters and was supposed to have a study done but had not heard anything. I called patient using [employer] ([place]) [phone]. Unfortunately, the NCS referral did not get entered at the time of visit on 08/03/2021, but I have entered that referral today and she will hear from Dr. [place] Blas office to schedule appointment. Patient expressed understanding.    Shena-sending to you as it has been a month since patient was in the office and referral was missed. I was unsure if you are able to get patient in for sooner appointment since it was our mistake. Thanks.

## 2021-09-15 ENCOUNTER — Telehealth: Payer: Self-pay | Admitting: Family Medicine

## 2021-09-15 NOTE — Telephone Encounter (Signed)
Copied from CRM [id number]. Topic: General - Other >> Sep 14, 2021 11:07 AM Gaetana Michaelis A wrote: Reason for CRM: The patient would like to be contacted by a member of staff regarding their previously discussed referral for orthopedic concerns   The patient shares that the discomfort in their hands continues to be problem for them   Please contact further when possible

## 2021-09-17 NOTE — Telephone Encounter (Signed)
Pt was called and given phone number for [facility].

## 2021-09-24 ENCOUNTER — Encounter: Payer: Self-pay | Admitting: Physical Medicine and Rehabilitation

## 2021-09-24 ENCOUNTER — Other Ambulatory Visit: Payer: Self-pay

## 2021-09-24 ENCOUNTER — Ambulatory Visit (INDEPENDENT_AMBULATORY_CARE_PROVIDER_SITE_OTHER): Payer: Self-pay | Admitting: Physical Medicine and Rehabilitation

## 2021-09-24 DIAGNOSIS — R202 Paresthesia of skin: Secondary | ICD-10-CM

## 2021-09-24 NOTE — Progress Notes (Signed)
Pt state pain in both arms. Pt state at night she feels numbness that goes from her hands up to her arms. Pt state when she put her hands down she can feel relief. Pt state she use to get inj years ago that helped for three months each time.   Numeric Pain Rating Scale and Functional Assessment Average Pain 7   In the last MONTH (on 0-10 scale) has pain interfered with the following?  1. General activity like being  able to carry out your everyday physical activities such as walking, climbing stairs, carrying groceries, or moving a chair?  Rating(9)   -BT, -Dye Allergies.

## 2021-09-24 NOTE — Progress Notes (Signed)
Amanda Preston - 36 y.o. female MRN [id number]  Date of birth: 1985-11-03  Office Visit Note: Visit Date: 09/24/2021 PCP: Hoy Register, MD Referred by: Hoy Register, MD  Subjective: Chief Complaint  Patient presents with   Right Hand - Pain   Left Hand - Pain   Left Arm - Pain   Right Arm - Pain   HPI:  Amanda Preston is a 36 y.o. female who comes in today at the request of Dr. Glee Arvin for electrodiagnostic study of the Bilateral upper extremities.  Patient is Right hand dominant.  She reports bilateral hand tingling and numbness left greater than right.  She does endorse significant pain.  She has been having the symptoms since 2014.   Symptoms she has are primarily to the index, long and ring fingers.  There are also symptoms up into the arms and shoulders.  She does get nocturnal complaints and a positive flick sign.  She has no prior electrodiagnostic studies.  She denies any frank radicular type symptoms.  ROS Otherwise per HPI.  Assessment & Plan: Visit Diagnoses:    ICD-10-CM   1. Paresthesia of skin  R20.2 NCV with EMG (electromyography)      Plan: Impression: The above electrodiagnostic study is ABNORMAL and reveals evidence of a moderate bilateral median nerve entrapment at the wrist (carpal tunnel syndrome) affecting sensory and motor components.   There is no significant electrodiagnostic evidence of any other focal nerve entrapment, brachial plexopathy or cervical radiculopathy.  As you know, this particular electrodiagnostic study cannot rule out chemical radiculitis or sensory only radiculopathy. **This electrodiagnostic study cannot rule out small fiber polyneuropathy and dysesthesias from central pain syndromes such as stroke or central pain sensitization syndromes such as fibromyalgia.  Myotomal referral pain from trigger points is also not excluded.  Recommendations: 1.  Follow-up with referring physician. 2.  Continue current management of  symptoms. 3.  Continue use of resting splint at night-time and as needed during the day. 4.  Suggest surgical evaluation.  Meds & Orders: No orders of the defined types were placed in this encounter.   Orders Placed This Encounter  Procedures   NCV with EMG (electromyography)    Follow-up: Return in about 2 weeks (around 10/08/2021) for Glee Arvin, MD.   Procedures: No procedures performed   EMG & NCV Findings: Evaluation of the left median motor and the right median motor nerves showed prolonged distal onset latency (L4.9, R4.4 ms) and decreased conduction velocity (Elbow-Wrist, L48, R48 m/s).  The left median (across palm) sensory nerve showed prolonged distal peak latency (Wrist, 5.2 ms) and prolonged distal peak latency (Palm, 2.5 ms).  The right median (across palm) sensory nerve showed no response (Palm) and prolonged distal peak latency (4.5 ms).  All remaining nerves (as indicated in the following tables) were within normal limits.  All left vs. right side differences were within normal limits.    All examined muscles (as indicated in the following table) showed no evidence of electrical instability.    Impression: The above electrodiagnostic study is ABNORMAL and reveals evidence of a moderate bilateral median nerve entrapment at the wrist (carpal tunnel syndrome) affecting sensory and motor components.   There is no significant electrodiagnostic evidence of any other focal nerve entrapment, brachial plexopathy or cervical radiculopathy.  As you know, this particular electrodiagnostic study cannot rule out chemical radiculitis or sensory only radiculopathy. **This electrodiagnostic study cannot rule out small fiber polyneuropathy and dysesthesias from central  pain syndromes such as stroke or central pain sensitization syndromes such as fibromyalgia.  Myotomal referral pain from trigger points is also not excluded.  Recommendations: 1.  Follow-up with referring physician. 2.   Continue current management of symptoms. 3.  Continue use of resting splint at night-time and as needed during the day. 4.  Suggest surgical evaluation.  ___________________________ Naaman Plummer FAAPMR Board Certified, American Board of Physical Medicine and Rehabilitation    Nerve Conduction Studies Anti Sensory Summary Table   Stim Site NR Peak (ms) Norm Peak (ms) P-T Amp (V) Norm P-T Amp Site1 Site2 Delta-P (ms) Dist (cm) Vel (m/s) Norm Vel (m/s)  Left Median Acr Palm Anti Sensory (2nd Digit)  29.9C  Wrist    *5.2 <3.6 27.8 >10 Wrist Palm 2.7 0.0    Palm    *2.5 <2.0 7.7         Right Median Acr Palm Anti Sensory (2nd Digit)  29.5C  Wrist    *4.5 <3.6 24.9 >10 Wrist Palm  0.0    Palm *NR  <2.0          Left Radial Anti Sensory (Base 1st Digit)  29.8C  Wrist    2.1 <3.1 36.8  Wrist Base 1st Digit 2.1 0.0    Left Ulnar Anti Sensory (5th Digit)  30C  Wrist    3.7 <3.7 42.1 >15.0 Wrist 5th Digit 3.7 14.0 38 >38   Motor Summary Table   Stim Site NR Onset (ms) Norm Onset (ms) O-P Amp (mV) Norm O-P Amp Site1 Site2 Delta-0 (ms) Dist (cm) Vel (m/s) Norm Vel (m/s)  Left Median Motor (Abd Poll Brev)  29.9C  Wrist    *4.9 <4.2 8.4 >5 Elbow Wrist 4.0 19.0 *48 >50  Elbow    8.9  8.3         Right Median Motor (Abd Poll Brev)  29.6C  Wrist    *4.4 <4.2 7.8 >5 Elbow Wrist 4.0 19.0 *48 >50  Elbow    8.4  7.8         Left Ulnar Motor (Abd Dig Min)  29.9C  Wrist    3.0 <4.2 11.0 >3 B Elbow Wrist 3.0 18.0 60 >53  B Elbow    6.0  11.3  A Elbow B Elbow 1.1 10.0 91 >53  A Elbow    7.1  11.2          EMG   Side Muscle Nerve Root Ins Act Fibs Psw Amp Dur Poly Recrt Int Dennie Bible Comment  Left Abd Poll Brev Median C8-T1 Nml Nml Nml Nml Nml 0 Nml Nml   Left 1stDorInt Ulnar C8-T1 Nml Nml Nml Nml Nml 0 Nml Nml   Left PronatorTeres Median C6-7 Nml Nml Nml Nml Nml 0 Nml Nml   Left Biceps Musculocut C5-6 Nml Nml Nml Nml Nml 0 Nml Nml   Left Deltoid Axillary C5-6 Nml Nml Nml Nml Nml 0 Nml Nml      Nerve Conduction Studies Anti Sensory Left/Right Comparison   Stim Site L Lat (ms) R Lat (ms) L-R Lat (ms) L Amp (V) R Amp (V) L-R Amp (%) Site1 Site2 L Vel (m/s) R Vel (m/s) L-R Vel (m/s)  Median Acr Palm Anti Sensory (2nd Digit)  29.9C  Wrist *5.2 *4.5 0.7 27.8 24.9 10.4 Wrist Palm     Palm *2.5   7.7         Radial Anti Sensory (Base 1st Digit)  29.8C  Wrist 2.1   36.8  Wrist Base 1st Digit     Ulnar Anti Sensory (5th Digit)  30C  Wrist 3.7   42.1   Wrist 5th Digit 38     Motor Left/Right Comparison   Stim Site L Lat (ms) R Lat (ms) L-R Lat (ms) L Amp (mV) R Amp (mV) L-R Amp (%) Site1 Site2 L Vel (m/s) R Vel (m/s) L-R Vel (m/s)  Median Motor (Abd Poll Brev)  29.9C  Wrist *4.9 *4.4 0.5 8.4 7.8 7.1 Elbow Wrist *48 *48 0  Elbow 8.9 8.4 0.5 8.3 7.8 6.0       Ulnar Motor (Abd Dig Min)  29.9C  Wrist 3.0   11.0   B Elbow Wrist 60    B Elbow 6.0   11.3   A Elbow B Elbow 91    A Elbow 7.1   11.2            Waveforms:                 Clinical History: No specialty comments available.     Objective:  VS:  HT:     WT:    BMI:      BP:    HR: bpm   TEMP: ( )   RESP:  Physical Exam Musculoskeletal:        General: No swelling, tenderness or deformity.     Comments: Inspection reveals no atrophy of the bilateral APB or FDI or hand intrinsics. There is no swelling, color changes, allodynia or dystrophic changes. There is 5 out of 5 strength in the bilateral wrist extension, finger abduction and long finger flexion. There is intact sensation to light touch in all dermatomal and peripheral nerve distributions. There is a positive Phalen's test bilaterally. There is a negative Hoffmann's test bilaterally.  Skin:    General: Skin is warm and dry.     Findings: No erythema or rash.  Neurological:     General: No focal deficit present.     Mental Status: She is alert and oriented to person, place, and time.     Motor: No weakness or abnormal muscle tone.      Coordination: Coordination normal.  Psychiatric:        Mood and Affect: Mood normal.        Behavior: Behavior normal.     Imaging: No results found.

## 2021-09-30 NOTE — Procedures (Signed)
EMG & NCV Findings: Evaluation of the left median motor and the right median motor nerves showed prolonged distal onset latency (L4.9, R4.4 ms) and decreased conduction velocity (Elbow-Wrist, L48, R48 m/s).  The left median (across palm) sensory nerve showed prolonged distal peak latency (Wrist, 5.2 ms) and prolonged distal peak latency (Palm, 2.5 ms).  The right median (across palm) sensory nerve showed no response (Palm) and prolonged distal peak latency (4.5 ms).  All remaining nerves (as indicated in the following tables) were within normal limits.  All left vs. right side differences were within normal limits.    All examined muscles (as indicated in the following table) showed no evidence of electrical instability.    Impression: The above electrodiagnostic study is ABNORMAL and reveals evidence of a moderate bilateral median nerve entrapment at the wrist (carpal tunnel syndrome) affecting sensory and motor components.   There is no significant electrodiagnostic evidence of any other focal nerve entrapment, brachial plexopathy or cervical radiculopathy.  As you know, this particular electrodiagnostic study cannot rule out chemical radiculitis or sensory only radiculopathy. **This electrodiagnostic study cannot rule out small fiber polyneuropathy and dysesthesias from central pain syndromes such as stroke or central pain sensitization syndromes such as fibromyalgia.  Myotomal referral pain from trigger points is also not excluded.  Recommendations: 1.  Follow-up with referring physician. 2.  Continue current management of symptoms. 3.  Continue use of resting splint at night-time and as needed during the day. 4.  Suggest surgical evaluation.  ___________________________ Amanda Preston FAAPMR Board Certified, American Board of Physical Medicine and Rehabilitation    Nerve Conduction Studies Anti Sensory Summary Table   Stim Site NR Peak (ms) Norm Peak (ms) P-T Amp (V) Norm P-T Amp Site1  Site2 Delta-P (ms) Dist (cm) Vel (m/s) Norm Vel (m/s)  Left Median Acr Palm Anti Sensory (2nd Digit)  29.9C  Wrist    *5.2 <3.6 27.8 >10 Wrist Palm 2.7 0.0    Palm    *2.5 <2.0 7.7         Right Median Acr Palm Anti Sensory (2nd Digit)  29.5C  Wrist    *4.5 <3.6 24.9 >10 Wrist Palm  0.0    Palm *NR  <2.0          Left Radial Anti Sensory (Base 1st Digit)  29.8C  Wrist    2.1 <3.1 36.8  Wrist Base 1st Digit 2.1 0.0    Left Ulnar Anti Sensory (5th Digit)  30C  Wrist    3.7 <3.7 42.1 >15.0 Wrist 5th Digit 3.7 14.0 38 >38   Motor Summary Table   Stim Site NR Onset (ms) Norm Onset (ms) O-P Amp (mV) Norm O-P Amp Site1 Site2 Delta-0 (ms) Dist (cm) Vel (m/s) Norm Vel (m/s)  Left Median Motor (Abd Poll Brev)  29.9C  Wrist    *4.9 <4.2 8.4 >5 Elbow Wrist 4.0 19.0 *48 >50  Elbow    8.9  8.3         Right Median Motor (Abd Poll Brev)  29.6C  Wrist    *4.4 <4.2 7.8 >5 Elbow Wrist 4.0 19.0 *48 >50  Elbow    8.4  7.8         Left Ulnar Motor (Abd Dig Min)  29.9C  Wrist    3.0 <4.2 11.0 >3 B Elbow Wrist 3.0 18.0 60 >53  B Elbow    6.0  11.3  A Elbow B Elbow 1.1 10.0 91 >53  A Elbow  7.1  11.2          EMG   Side Muscle Nerve Root Ins Act Fibs Psw Amp Dur Poly Recrt Int Dennie Bible Comment  Left Abd Poll Brev Median C8-T1 Nml Nml Nml Nml Nml 0 Nml Nml   Left 1stDorInt Ulnar C8-T1 Nml Nml Nml Nml Nml 0 Nml Nml   Left PronatorTeres Median C6-7 Nml Nml Nml Nml Nml 0 Nml Nml   Left Biceps Musculocut C5-6 Nml Nml Nml Nml Nml 0 Nml Nml   Left Deltoid Axillary C5-6 Nml Nml Nml Nml Nml 0 Nml Nml     Nerve Conduction Studies Anti Sensory Left/Right Comparison   Stim Site L Lat (ms) R Lat (ms) L-R Lat (ms) L Amp (V) R Amp (V) L-R Amp (%) Site1 Site2 L Vel (m/s) R Vel (m/s) L-R Vel (m/s)  Median Acr Palm Anti Sensory (2nd Digit)  29.9C  Wrist *5.2 *4.5 0.7 27.8 24.9 10.4 Wrist Palm     Palm *2.5   7.7         Radial Anti Sensory (Base 1st Digit)  29.8C  Wrist 2.1   36.8   Wrist Base 1st Digit      Ulnar Anti Sensory (5th Digit)  30C  Wrist 3.7   42.1   Wrist 5th Digit 38     Motor Left/Right Comparison   Stim Site L Lat (ms) R Lat (ms) L-R Lat (ms) L Amp (mV) R Amp (mV) L-R Amp (%) Site1 Site2 L Vel (m/s) R Vel (m/s) L-R Vel (m/s)  Median Motor (Abd Poll Brev)  29.9C  Wrist *4.9 *4.4 0.5 8.4 7.8 7.1 Elbow Wrist *48 *48 0  Elbow 8.9 8.4 0.5 8.3 7.8 6.0       Ulnar Motor (Abd Dig Min)  29.9C  Wrist 3.0   11.0   B Elbow Wrist 60    B Elbow 6.0   11.3   A Elbow B Elbow 91    A Elbow 7.1   11.2            Waveforms:

## 2021-10-05 ENCOUNTER — Encounter: Payer: Self-pay | Admitting: Orthopaedic Surgery

## 2021-10-05 ENCOUNTER — Ambulatory Visit (INDEPENDENT_AMBULATORY_CARE_PROVIDER_SITE_OTHER): Payer: Self-pay | Admitting: Orthopaedic Surgery

## 2021-10-05 ENCOUNTER — Other Ambulatory Visit: Payer: Self-pay

## 2021-10-05 DIAGNOSIS — G5603 Carpal tunnel syndrome, bilateral upper limbs: Secondary | ICD-10-CM

## 2021-10-05 NOTE — Progress Notes (Signed)
° °  Office Visit Note   Patient: Amanda Preston           Date of Birth: 07/01/1986           MRN: [id number] Visit Date: 10/05/2021              Requested by: Hoy Register, MD [address] [place],  [place] [address] PCP: Hoy Register, MD   Assessment & Plan: Visit Diagnoses:  1. Bilateral carpal tunnel syndrome     Plan: Patient returns today for nerve conduction study review.  Video interpreter used today.  Bilateral hand exams are unchanged.  Nerve conduction studies show bilateral moderate carpal tunnel syndrome.  These findings were reviewed with the patient in detail and treatment options were discussed to include cortisone injection versus carpal tunnel release and the associated risks and benefits and prognosis.  Based on her options she would like to move forward with a left carpal tunnel release in the near future.  Eunice Blase will meet with the patient today to schedule surgery.  Follow-Up Instructions: No follow-ups on file.   Orders:  No orders of the defined types were placed in this encounter.  No orders of the defined types were placed in this encounter.     Procedures: No procedures performed   Clinical Data: No additional findings.   Subjective: Chief Complaint  Patient presents with   Left Shoulder - Follow-up   Right Hand - Follow-up   Left Hand - Follow-up    HPI  Review of Systems   Objective: Vital Signs: There were no vitals taken for this visit.  Physical Exam  Ortho Exam  Specialty Comments:  No specialty comments available.  Imaging: No results found.   PMFS History: Patient Active Problem List   Diagnosis Date Noted   Post term pregnancy at [redacted] weeks gestation 11/02/2019   Positive GBS test 11/02/2019   COVID-19 03/05/2019   Past Medical History:  Diagnosis Date   Infection    UTI    Family History  Problem Relation Age of Onset   Kidney disease Mother    Diabetes Mother    Hypertension Sister      Past Surgical History:  Procedure Laterality Date   NO PAST SURGERIES     Social History   Occupational History   Not on file  Tobacco Use   Smoking status: Never   Smokeless tobacco: Never  Vaping Use   Vaping Use: Never used  Substance and Sexual Activity   Alcohol use: Not Currently    Comment: rare   Drug use: Never   Sexual activity: Not Currently

## 2021-10-07 ENCOUNTER — Ambulatory Visit: Payer: Self-pay | Admitting: Physician Assistant

## 2021-10-13 ENCOUNTER — Encounter (HOSPITAL_BASED_OUTPATIENT_CLINIC_OR_DEPARTMENT_OTHER): Payer: Self-pay | Admitting: Orthopaedic Surgery

## 2021-10-19 NOTE — Progress Notes (Signed)

## 2021-10-20 ENCOUNTER — Encounter (HOSPITAL_BASED_OUTPATIENT_CLINIC_OR_DEPARTMENT_OTHER): Payer: Self-pay | Admitting: Orthopaedic Surgery

## 2021-10-20 ENCOUNTER — Ambulatory Visit (HOSPITAL_BASED_OUTPATIENT_CLINIC_OR_DEPARTMENT_OTHER): Payer: Self-pay | Admitting: Anesthesiology

## 2021-10-20 ENCOUNTER — Encounter (HOSPITAL_BASED_OUTPATIENT_CLINIC_OR_DEPARTMENT_OTHER)
Admission: RE | Disposition: A | Discharge status: Home / Self Care | Payer: Self-pay | Source: Home / Self Care | Attending: Orthopaedic Surgery

## 2021-10-20 ENCOUNTER — Ambulatory Visit (HOSPITAL_BASED_OUTPATIENT_CLINIC_OR_DEPARTMENT_OTHER)
Admission: RE | Admit: 2021-10-20 | Discharge: 2021-10-20 | Disposition: A | Discharge status: Home / Self Care | Payer: Self-pay | Attending: Orthopaedic Surgery | Admitting: Orthopaedic Surgery

## 2021-10-20 ENCOUNTER — Other Ambulatory Visit: Payer: Self-pay

## 2021-10-20 DIAGNOSIS — G5602 Carpal tunnel syndrome, left upper limb: Secondary | ICD-10-CM | POA: Diagnosis present

## 2021-10-20 HISTORY — PX: CARPAL TUNNEL RELEASE: SHX101

## 2021-10-20 LAB — POCT PREGNANCY, URINE: Preg Test, Ur: NEGATIVE

## 2021-10-20 SURGERY — CARPAL TUNNEL RELEASE
Anesthesia: Monitor Anesthesia Care | Site: Wrist | Laterality: Left

## 2021-10-20 MED ORDER — CEFAZOLIN SODIUM-DEXTROSE 2-4 GM/100ML-% IV SOLN
2.0000 g | INTRAVENOUS | Status: AC
  Administered 2021-10-20: 2 g via INTRAVENOUS

## 2021-10-20 MED ORDER — PROPOFOL 500 MG/50ML IV EMUL
INTRAVENOUS | Status: DC | PRN
  Administered 2021-10-20: 100 ug/kg/min via INTRAVENOUS

## 2021-10-20 MED ORDER — LACTATED RINGERS IV SOLN: INTRAVENOUS | Status: DC

## 2021-10-20 MED ORDER — HYDROCODONE-ACETAMINOPHEN 5-325 MG PO TABS: 1.0000 | ORAL_TABLET | Freq: Four times a day (QID) | ORAL | 0 refills | Status: AC | PRN

## 2021-10-20 MED ORDER — ACETAMINOPHEN 500 MG PO TABS
1000.0000 mg | ORAL_TABLET | Freq: Once | ORAL | Status: AC
  Administered 2021-10-20: 1000 mg via ORAL

## 2021-10-20 MED ORDER — CEFAZOLIN SODIUM-DEXTROSE 2-4 GM/100ML-% IV SOLN
INTRAVENOUS | Status: AC
  Filled 2021-10-20: qty 100

## 2021-10-20 MED ORDER — FENTANYL CITRATE (PF) 100 MCG/2ML IJ SOLN: 25.0000 ug | INTRAMUSCULAR | Status: DC | PRN

## 2021-10-20 MED ORDER — MIDAZOLAM HCL 2 MG/2ML IJ SOLN
INTRAMUSCULAR | Status: AC
  Filled 2021-10-20: qty 2

## 2021-10-20 MED ORDER — MIDAZOLAM HCL 5 MG/5ML IJ SOLN
INTRAMUSCULAR | Status: DC | PRN
  Administered 2021-10-20: 2 mg via INTRAVENOUS

## 2021-10-20 MED ORDER — PROMETHAZINE HCL 25 MG/ML IJ SOLN: 6.2500 mg | INTRAMUSCULAR | Status: DC | PRN

## 2021-10-20 MED ORDER — FENTANYL CITRATE (PF) 100 MCG/2ML IJ SOLN
INTRAMUSCULAR | Status: AC
  Filled 2021-10-20: qty 2

## 2021-10-20 MED ORDER — BUPIVACAINE HCL (PF) 0.25 % IJ SOLN
INTRAMUSCULAR | Status: DC | PRN
  Administered 2021-10-20: 10 mL

## 2021-10-20 MED ORDER — FENTANYL CITRATE (PF) 100 MCG/2ML IJ SOLN
INTRAMUSCULAR | Status: DC | PRN
  Administered 2021-10-20: 50 ug via INTRAVENOUS

## 2021-10-20 MED ORDER — ACETAMINOPHEN 500 MG PO TABS
ORAL_TABLET | ORAL | Status: AC
  Filled 2021-10-20: qty 2

## 2021-10-20 MED ORDER — ONDANSETRON HCL 4 MG/2ML IJ SOLN
INTRAMUSCULAR | Status: DC | PRN
Start: 2021-10-20 — End: 2021-10-20
  Administered 2021-10-20: 4 mg via INTRAVENOUS

## 2021-10-20 MED ORDER — MIDAZOLAM HCL 2 MG/2ML IJ SOLN: 0.5000 mg | Freq: Once | INTRAMUSCULAR | Status: DC | PRN

## 2021-10-20 SURGICAL SUPPLY — 47 items
BAND INSRT 18 STRL LF DISP RB (MISCELLANEOUS) ×2
BAND RUBBER #18 3X1/16 STRL (MISCELLANEOUS) ×4 IMPLANT
BLADE MINI RND TIP GREEN BEAV (BLADE) ×2 IMPLANT
BLADE SURG 15 STRL LF DISP TIS (BLADE) ×1 IMPLANT
BLADE SURG 15 STRL SS (BLADE) ×2
BNDG CMPR 9X4 STRL LF SNTH (GAUZE/BANDAGES/DRESSINGS) ×1
BNDG ELASTIC 3X5.8 VLCR STR LF (GAUZE/BANDAGES/DRESSINGS) ×2 IMPLANT
BNDG ESMARK 4X9 LF (GAUZE/BANDAGES/DRESSINGS) ×2 IMPLANT
BNDG PLASTER X FAST 3X3 WHT LF (CAST SUPPLIES) IMPLANT
BNDG PLSTR 9X3 FST ST WHT (CAST SUPPLIES)
BRUSH SCRUB EZ PLAIN DRY (MISCELLANEOUS) ×2 IMPLANT
CANISTER SUCT 1200ML W/VALVE (MISCELLANEOUS) ×2 IMPLANT
CORD BIPOLAR FORCEPS 12FT (ELECTRODE) ×2 IMPLANT
COVER BACK TABLE 60X90IN (DRAPES) ×2 IMPLANT
COVER MAYO STAND STRL (DRAPES) ×2 IMPLANT
CUFF TOURN SGL QUICK 18X4 (TOURNIQUET CUFF) IMPLANT
DECANTER SPIKE VIAL GLASS SM (MISCELLANEOUS) IMPLANT
DRAPE EXTREMITY T 121X128X90 (DISPOSABLE) ×2 IMPLANT
DRAPE IMP U-DRAPE 54X76 (DRAPES) ×2 IMPLANT
DRAPE SURG 17X23 STRL (DRAPES) ×2 IMPLANT
GAUZE 4X4 16PLY ~~LOC~~+RFID DBL (SPONGE) IMPLANT
GAUZE SPONGE 4X4 12PLY STRL (GAUZE/BANDAGES/DRESSINGS) ×2 IMPLANT
GAUZE XEROFORM 1X8 LF (GAUZE/BANDAGES/DRESSINGS) ×2 IMPLANT
GLOVE SURG NEOP MICRO LF SZ7.5 (GLOVE) ×2 IMPLANT
GLOVE SURG SYN 7.5  E (GLOVE) ×1
GLOVE SURG SYN 7.5 E (GLOVE) ×1 IMPLANT
GLOVE SURG SYN 7.5 PF PI (GLOVE) ×1 IMPLANT
GLOVE SURG UNDER POLY LF SZ7 (GLOVE) ×2 IMPLANT
GLOVE SURG UNDER POLY LF SZ7.5 (GLOVE) ×2 IMPLANT
GOWN STRL REIN XL XLG (GOWN DISPOSABLE) ×4 IMPLANT
GOWN STRL REUS W/ TWL LRG LVL3 (GOWN DISPOSABLE) ×1 IMPLANT
GOWN STRL REUS W/TWL LRG LVL3 (GOWN DISPOSABLE) ×2
NDL HYPO 25X1 1.5 SAFETY (NEEDLE) IMPLANT
NEEDLE HYPO 25X1 1.5 SAFETY (NEEDLE) IMPLANT
NS IRRIG 1000ML POUR BTL (IV SOLUTION) ×2 IMPLANT
PACK BASIN DAY SURGERY FS (CUSTOM PROCEDURE TRAY) ×2 IMPLANT
PAD CAST 3X4 CTTN HI CHSV (CAST SUPPLIES) ×1 IMPLANT
PADDING CAST COTTON 3X4 STRL (CAST SUPPLIES) ×2
SHEET MEDIUM DRAPE 40X70 STRL (DRAPES) ×2 IMPLANT
STOCKINETTE 4X48 STRL (DRAPES) ×2 IMPLANT
SUT ETHILON 3 0 PS 1 (SUTURE) ×2 IMPLANT
SYR BULB EAR ULCER 3OZ GRN STR (SYRINGE) ×2 IMPLANT
SYR CONTROL 10ML LL (SYRINGE) IMPLANT
TOWEL GREEN STERILE FF (TOWEL DISPOSABLE) ×2 IMPLANT
TRAY DSU PREP LF (CUSTOM PROCEDURE TRAY) ×2 IMPLANT
TUBE CONNECTING 20X1/4 (TUBING) IMPLANT
UNDERPAD 30X36 HEAVY ABSORB (UNDERPADS AND DIAPERS) ×2 IMPLANT

## 2021-10-20 NOTE — Transfer of Care (Signed)
Immediate Anesthesia Transfer of Care Note  Patient: Amanda Preston  Procedure(s) Performed: LEFT CARPAL TUNNEL RELEASE (Left: Wrist)  Patient Location: PACU  Anesthesia Type:MAC  Level of Consciousness: awake, alert  and oriented  Airway & Oxygen Therapy: Patient Spontanous Breathing and Patient connected to face mask oxygen  Post-op Assessment: Report given to RN and Post -op Vital signs reviewed and stable  Post vital signs: Reviewed and stable  Last Vitals:  Vitals Value Taken Time  BP    Temp    Pulse    Resp    SpO2      Last Pain:  Vitals:   10/20/21 1107  TempSrc: Oral  PainSc: 0-No pain         Complications: No notable events documented.

## 2021-10-20 NOTE — H&P (Signed)
° ° °  PREOPERATIVE H&P  Chief Complaint: left carpal tunnel syndrome  HPI: Amanda Preston is a 36 y.o. female who presents for surgical treatment of left carpal tunnel syndrome.  She denies any changes in medical history.  Past Medical History:  Diagnosis Date   Infection    UTI   Past Surgical History:  Procedure Laterality Date   NO PAST SURGERIES     Social History   Socioeconomic History   Marital status: Single    Spouse name: Not on file   Number of children: Not on file   Years of education: Not on file   Highest education level: Not on file  Occupational History   Not on file  Tobacco Use   Smoking status: Never   Smokeless tobacco: Never  Vaping Use   Vaping Use: Never used  Substance and Sexual Activity   Alcohol use: Not Currently    Comment: rare   Drug use: Never   Sexual activity: Not Currently  Other Topics Concern   Not on file  Social History Narrative   Not on file   Social Determinants of Health   Financial Resource Strain: Not on file  Food Insecurity: Not on file  Transportation Needs: Not on file  Physical Activity: Not on file  Stress: Not on file  Social Connections: Not on file   Family History  Problem Relation Age of Onset   Kidney disease Mother    Diabetes Mother    Hypertension Sister    No Known Allergies Prior to Admission medications   Not on File     Positive ROS: All other systems have been reviewed and were otherwise negative with the exception of those mentioned in the HPI and as above.  Physical Exam: General: Alert, no acute distress Cardiovascular: No pedal edema Respiratory: No cyanosis, no use of accessory musculature GI: abdomen soft Skin: No lesions in the area of chief complaint Neurologic: Sensation intact distally Psychiatric: Patient is competent for consent with normal mood and affect Lymphatic: no lymphedema  MUSCULOSKELETAL: exam stable  Assessment: left carpal tunnel  syndrome  Plan: Plan for Procedure(s): LEFT CARPAL TUNNEL RELEASE  The risks benefits and alternatives were discussed with the patient including but not limited to the risks of nonoperative treatment, versus surgical intervention including infection, bleeding, nerve injury,  blood clots, cardiopulmonary complications, morbidity, mortality, among others, and they were willing to proceed.   Preoperative templating of the joint replacement has been completed, documented, and submitted to the Operating Room personnel in order to optimize intra-operative equipment management.   Glee Arvin, MD 10/20/2021 11:04 AM

## 2021-10-20 NOTE — Op Note (Signed)
° °  Carpal tunnel op note  DATE OF SURGERY:10/20/2021  PREOPERATIVE DIAGNOSIS:  Left carpal tunnel syndrome  POSTOPERATIVE DIAGNOSIS: same  PROCEDURE: Left carpal tunnel release. CPT 99833  SURGEON: Marianna Payment, M.D.  ASSIST: None  ANESTHESIA:  Local and MAC  TOURNIQUET TIME: less than 20 minutes  BLOOD LOSS: Minimal.  COMPLICATIONS: None.  PATHOLOGY: None.  INDICATIONS: The patient is a 36 y.o. -year-old female who presented with carpal tunnel syndrome failing nonsurgical management, indicated for surgical release.  DESCRIPTION OF PROCEDURE: The patient was identified in the preoperative holding area.  The operative site was marked by the surgeon and confirmed by the patient.  The patient was brought back to the operating room.  MAC anesthesia was administered.  Local anesthetic with epi was injected into the operative site.  A well padded nonsterile tourniquet was placed. The operative extremity was prepped and draped in standard sterile fashion.  A timeout was performed.  Preoperative antibiotics were given.   A palmar incision was made about 5 mm ulnar to the thenar crease.  The palmar aponeurosis was exposed and divided in line with the skin incision. The palmaris brevis was visualized and divided.  The distal edge of the transcarpal ligament was identified. A hemostat was inserted into the carpal tunnel to protect the median nerve and the flexor tendons. Then, the transverse carpal ligament was released under direct visualization. Proximally, a subcutaneous tunnel was made allowing a Sewell retractor to be placed. Then, the distal portion of the antebrachial fascia was released. Distally, all fibrous bands were released. The median nerve was visualized, and the fat pad was exposed. Wound was irrigated and closed with 4-0 nylon sutures. Sterile dressing applied. The patient was transferred to the recovery room in stable condition after all counts were correct.  POSTOPERATIVE  PLAN: To start nerve gliding exercises as tolerated and no heavy lifting for four weeks.  Eduard Roux, M.D. [facility] 2:00 PM

## 2021-10-20 NOTE — Anesthesia Postprocedure Evaluation (Signed)
Anesthesia Post Note  Patient: Amanda Preston  Procedure(s) Performed: LEFT CARPAL TUNNEL RELEASE (Left: Wrist)     Patient location during evaluation: PACU Anesthesia Type: MAC Level of consciousness: awake and alert Pain management: pain level controlled Vital Signs Assessment: post-procedure vital signs reviewed and stable Respiratory status: spontaneous breathing, nonlabored ventilation and respiratory function stable Cardiovascular status: blood pressure returned to baseline Postop Assessment: no apparent nausea or vomiting Anesthetic complications: no   No notable events documented.  Last Vitals:  Vitals:   10/20/21 1420 10/20/21 1435  BP: 134/78 115/76  Pulse: (!) 57 (!) 59  Resp: 14   Temp:  36.6 C  SpO2: 99% 98%    Last Pain:  Vitals:   10/20/21 1435  TempSrc: Oral  PainSc: 0-No pain                 Marthenia Rolling

## 2021-10-20 NOTE — Anesthesia Preprocedure Evaluation (Signed)
Anesthesia Evaluation  Patient identified by MRN, date of birth, ID band Patient awake    Reviewed: Allergy & Precautions, NPO status , Patient's Chart, lab work & pertinent test results  History of Anesthesia Complications Negative for: history of anesthetic complications  Airway Mallampati: II  TM Distance: >3 FB Neck ROM: Full    Dental  (+) Dental Advisory Given   Pulmonary neg pulmonary ROS,    breath sounds clear to auscultation       Cardiovascular negative cardio ROS   Rhythm:Regular Rate:Normal     Neuro/Psych negative neurological ROS     GI/Hepatic negative GI ROS, Neg liver ROS,   Endo/Other  negative endocrine ROS  Renal/GU negative Renal ROS     Musculoskeletal   Abdominal   Peds  Hematology negative hematology ROS (+)   Anesthesia Other Findings   Reproductive/Obstetrics                             Anesthesia Physical Anesthesia Plan  ASA: 1  Anesthesia Plan: MAC   Post-op Pain Management: Tylenol PO (pre-op) and Minimal or no pain anticipated   Induction:   PONV Risk Score and Plan: 2 and Ondansetron and Treatment may vary due to age or medical condition  Airway Management Planned: Natural Airway and Nasal Cannula  Additional Equipment: None  Intra-op Plan:   Post-operative Plan:   Informed Consent: I have reviewed the patients History and Physical, chart, labs and discussed the procedure including the risks, benefits and alternatives for the proposed anesthesia with the patient or authorized representative who has indicated his/her understanding and acceptance.     Dental advisory given and Interpreter used for interveiw  Plan Discussed with: CRNA and Surgeon  Anesthesia Plan Comments:         Anesthesia Quick Evaluation

## 2021-10-20 NOTE — Discharge Instructions (Addendum)
Postoperative instructions:  Weightbearing instructions: don't lift more than 10 lbs  Dressing instructions: Keep your dressing and/or splint clean and dry at all times.  It will be removed at your first post-operative appointment.  Your stitches and/or staples will be removed at this visit.  Incision instructions:  Do not soak your incision for 3 weeks after surgery.  If the incision gets wet, pat dry and do not scrub the incision.  Pain control:  You have been given a prescription to be taken as directed for post-operative pain control.  In addition, elevate the operative extremity above the heart at all times to prevent swelling and throbbing pain.  Take over-the-counter Colace, 100mg  by mouth twice a day while taking narcotic pain medications to help prevent constipation.  Follow up appointments: 1) 7 days for suture removal and wound check. 2) Dr. Erlinda Hong as scheduled.   -------------------------------------------------------------------------------------------------------------  After Surgery Pain Control:  After your surgery, post-surgical discomfort or pain is likely. This discomfort can last several days to a few weeks. At certain times of the day your discomfort may be more intense.  Did you receive a nerve block?  A nerve block can provide pain relief for one hour to two days after your surgery. As long as the nerve block is working, you will experience little or no sensation in the area the surgeon operated on.  As the nerve block wears off, you will begin to experience pain or discomfort. It is very important that you begin taking your prescribed pain medication before the nerve block fully wears off. Treating your pain at the first sign of the block wearing off will ensure your pain is better controlled and more tolerable when full-sensation returns. Do not wait until the pain is intolerable, as the medicine will be less effective. It is better to treat pain in advance than to try  and catch up.  General Anesthesia:  If you did not receive a nerve block during your surgery, you will need to start taking your pain medication shortly after your surgery and should continue to do so as prescribed by your surgeon.  Pain Medication:  Most commonly we prescribe Vicodin and Percocet for post-operative pain. Both of these medications contain a combination of acetaminophen (Tylenol) and a narcotic to help control pain.   It takes between 30 and 45 minutes before pain medication starts to work. It is important to take your medication before your pain level gets too intense.   Nausea is a common side effect of many pain medications. You will want to eat something before taking your pain medicine to help prevent nausea.   If you are taking a prescription pain medication that contains acetaminophen, we recommend that you do not take additional over the counter acetaminophen (Tylenol).  Other pain relieving options:   Using a cold pack to ice the affected area a few times a day (15 to 20 minutes at a time) can help to relieve pain, reduce swelling and bruising.   Elevation of the affected area can also help to reduce pain and swelling.  May take Tylenol after 5pm, if needed.    Post Anesthesia Home Care Instructions  Activity: Get plenty of rest for the remainder of the day. A responsible individual must stay with you for 24 hours following the procedure.  For the next 24 hours, DO NOT: -Drive a car -Paediatric nurse -Drink alcoholic beverages -Take any medication unless instructed by your physician -Make any legal decisions or sign  important papers.  Meals: Start with liquid foods such as gelatin or soup. Progress to regular foods as tolerated. Avoid greasy, spicy, heavy foods. If nausea and/or vomiting occur, drink only clear liquids until the nausea and/or vomiting subsides. Call your physician if vomiting continues.  Special Instructions/Symptoms: Your throat may  feel dry or sore from the anesthesia or the breathing tube placed in your throat during surgery. If this causes discomfort, gargle with warm salt water. The discomfort should disappear within 24 hours.  If you had a scopolamine patch placed behind your ear for the management of post- operative nausea and/or vomiting:  1. The medication in the patch is effective for 72 hours, after which it should be removed.  Wrap patch in a tissue and discard in the trash. Wash hands thoroughly with soap and water. 2. You may remove the patch earlier than 72 hours if you experience unpleasant side effects which may include dry mouth, dizziness or visual disturbances. 3. Avoid touching the patch. Wash your hands with soap and water after contact with the patch.

## 2021-10-21 ENCOUNTER — Encounter (HOSPITAL_BASED_OUTPATIENT_CLINIC_OR_DEPARTMENT_OTHER): Payer: Self-pay | Admitting: Orthopaedic Surgery

## 2021-10-21 NOTE — Progress Notes (Signed)
VM not set up.

## 2021-10-29 ENCOUNTER — Other Ambulatory Visit: Payer: Self-pay

## 2021-10-29 ENCOUNTER — Ambulatory Visit (INDEPENDENT_AMBULATORY_CARE_PROVIDER_SITE_OTHER): Payer: Self-pay | Admitting: Physician Assistant

## 2021-10-29 ENCOUNTER — Encounter: Payer: Self-pay | Admitting: Orthopaedic Surgery

## 2021-10-29 DIAGNOSIS — Z9889 Other specified postprocedural states: Secondary | ICD-10-CM

## 2021-10-29 NOTE — Progress Notes (Signed)
° °  Post-Op Visit Note   Patient: Amanda Preston           Date of Birth: 12-Jul-1986           MRN: [id number] Visit Date: 10/29/2021 PCP: Hoy Register, MD   Assessment & Plan:  Chief Complaint:  Chief Complaint  Patient presents with   Left Wrist - Follow-up    Left carpal tunnel release 10/20/2021   Visit Diagnoses:  1. S/P carpal tunnel release     Plan: Patient is a pleasant 36 year old Spanish-speaking female who is here today with interpreter.  She is 1 week status post left carpal tunnel release 10/20/2021.  She has been doing well.  She has no pain.  She denies any paresthesias.  Examination of the left hand reveals a well-healing surgical incision with nylon sutures in place.  No evidence of infection or cellulitis.  She is neurovascular tact distally.  Today, wound was cleaned and recovered.  Removable Velcro splint applied which she will wear for the next week.  No heavy lifting or submerging her hand underwater for another 3 weeks.  Follow-up with [place] next week for suture removal.  Call with concerns or questions in the meantime.  Follow-Up Instructions: Return in about 1 week (around 11/05/2021).   Orders:  No orders of the defined types were placed in this encounter.  No orders of the defined types were placed in this encounter.   Imaging: No new imaging  PMFS History: Patient Active Problem List   Diagnosis Date Noted   Carpal tunnel syndrome on left    Post term pregnancy at [redacted] weeks gestation 11/02/2019   Positive GBS test 11/02/2019   COVID-19 03/05/2019   Past Medical History:  Diagnosis Date   Infection    UTI    Family History  Problem Relation Age of Onset   Kidney disease Mother    Diabetes Mother    Hypertension Sister     Past Surgical History:  Procedure Laterality Date   CARPAL TUNNEL RELEASE Left 10/20/2021   Procedure: LEFT CARPAL TUNNEL RELEASE;  Surgeon: Tarry Kos, MD;  Location: [facility];  Service:  Orthopedics;  Laterality: Left;   NO PAST SURGERIES     Social History   Occupational History   Not on file  Tobacco Use   Smoking status: Never   Smokeless tobacco: Never  Vaping Use   Vaping Use: Never used  Substance and Sexual Activity   Alcohol use: Not Currently    Comment: rare   Drug use: Never   Sexual activity: Not Currently

## 2021-11-05 ENCOUNTER — Ambulatory Visit (INDEPENDENT_AMBULATORY_CARE_PROVIDER_SITE_OTHER): Payer: Self-pay | Admitting: Orthopaedic Surgery

## 2021-11-05 ENCOUNTER — Other Ambulatory Visit: Payer: Self-pay

## 2021-11-05 DIAGNOSIS — G5602 Carpal tunnel syndrome, left upper limb: Secondary | ICD-10-CM

## 2021-11-05 DIAGNOSIS — Z9889 Other specified postprocedural states: Secondary | ICD-10-CM

## 2021-11-05 NOTE — Progress Notes (Signed)
° °  Post-Op Visit Note   Patient: Amanda Preston           Date of Birth: 27-Nov-1985           MRN: [id number] Visit Date: 11/05/2021 PCP: Charlott Rakes, MD   Assessment & Plan:  Chief Complaint:  Chief Complaint  Patient presents with   Left Hand - Routine Post Op   Visit Diagnoses:  1. S/P carpal tunnel release   2. Carpal tunnel syndrome, left     Plan: Patient is a pleasant 36 year old Spanish-speaking female is here today with interpreter.  She is 2 weeks status post left carpal tunnel release 10/20/2021.  She has been doing well.  She denies any pain or paresthesias.  Examination of her left wrist reveals a fully healed surgical incision with nylon sutures in place.  No evidence of infection or cellulitis.  Fingers warm well perfused.  Today, sutures were removed and Steri-Strips applied.  She will begin range of motion exercises.  She will wear the splint for comfort.  No heavy lifting or submerging her hand underwater another 2 weeks. Follow-up with [place] in 4 weeks time for repeat evaluation.   Follow-Up Instructions: Return in about 4 weeks (around 12/03/2021).   Orders:  No orders of the defined types were placed in this encounter.  No orders of the defined types were placed in this encounter.   Imaging: No new imaging  PMFS History: Patient Active Problem List   Diagnosis Date Noted   Carpal tunnel syndrome on left    Post term pregnancy at [redacted] weeks gestation 11/02/2019   Positive GBS test 11/02/2019   COVID-19 03/05/2019   Past Medical History:  Diagnosis Date   Infection    UTI    Family History  Problem Relation Age of Onset   Kidney disease Mother    Diabetes Mother    Hypertension Sister     Past Surgical History:  Procedure Laterality Date   CARPAL TUNNEL RELEASE Left 10/20/2021   Procedure: LEFT CARPAL TUNNEL RELEASE;  Surgeon: Leandrew Koyanagi, MD;  Location: [facility];  Service: Orthopedics;  Laterality: Left;   NO PAST  SURGERIES     Social History   Occupational History   Not on file  Tobacco Use   Smoking status: Never   Smokeless tobacco: Never  Vaping Use   Vaping Use: Never used  Substance and Sexual Activity   Alcohol use: Not Currently    Comment: rare   Drug use: Never   Sexual activity: Not Currently

## 2021-12-01 ENCOUNTER — Telehealth: Payer: Self-pay | Admitting: Family Medicine

## 2021-12-01 NOTE — Telephone Encounter (Signed)
Copied from CRM [id number]. Topic: Appointment Scheduling - Scheduling Inquiry for Clinic ?>> Dec 01, 2021 10:42 AM Elliot Gault wrote: ?Reason for CRM: patient completed orange card application and would like to schedule appointment in the moth of March. ?

## 2021-12-02 NOTE — Telephone Encounter (Signed)
I return Pt call, schedule a financial appt ?

## 2021-12-03 ENCOUNTER — Other Ambulatory Visit: Payer: Self-pay

## 2021-12-03 ENCOUNTER — Ambulatory Visit (INDEPENDENT_AMBULATORY_CARE_PROVIDER_SITE_OTHER): Payer: Self-pay | Admitting: Orthopaedic Surgery

## 2021-12-03 DIAGNOSIS — Z9889 Other specified postprocedural states: Secondary | ICD-10-CM | POA: Insufficient documentation

## 2021-12-03 NOTE — Progress Notes (Signed)
? ?  Post-Op Visit Note ?  ?Patient: Amanda Preston           ?Date of Birth: 03/13/1986           ?MRN: [id number] ?Visit Date: 12/03/2021 ?PCP: Hoy Register, MD ? ? ?Assessment & Plan: ? ?Chief Complaint:  ?Chief Complaint  ?Patient presents with  ? Left Hand - Routine Post Op  ? ?Visit Diagnoses:  ?1. S/P carpal tunnel release   ? ? ?Plan: Patient is here 6 weeks status post left carpal tunnel release on 10/20/2021.  Overall she is doing well and reports no pain.  She has occasional numbness but overall her symptoms have greatly improved. ? ?Examination left hand shows a fully healed surgical scar.  She is able to make a full composite fist.  She can oppose the thumb tip to the fifth metacarpal head.  Scar is nontender but slightly dry. ? ?Patient has recovered from the surgery well.  She may continue to increase activity to her hand as tolerated.  She will reach out to [place] when she is ready to schedule surgery for the right carpal tunnel. ? ?Follow-Up Instructions: No follow-ups on file.  ? ?Orders:  ?No orders of the defined types were placed in this encounter. ? ?No orders of the defined types were placed in this encounter. ? ? ?Imaging: ?No results found. ? ?PMFS History: ?Patient Active Problem List  ? Diagnosis Date Noted  ? S/P carpal tunnel release 12/03/2021  ? Carpal tunnel syndrome on left   ? Post term pregnancy at [redacted] weeks gestation 11/02/2019  ? Positive GBS test 11/02/2019  ? COVID-19 03/05/2019  ? ?Past Medical History:  ?Diagnosis Date  ? Infection   ? UTI  ?  ?Family History  ?Problem Relation Age of Onset  ? Kidney disease Mother   ? Diabetes Mother   ? Hypertension Sister   ?  ?Past Surgical History:  ?Procedure Laterality Date  ? CARPAL TUNNEL RELEASE Left 10/20/2021  ? Procedure: LEFT CARPAL TUNNEL RELEASE;  Surgeon: Tarry Kos, MD;  Location: [facility];  Service: Orthopedics;  Laterality: Left;  ? NO PAST SURGERIES    ? ?Social History  ? ?Occupational History  ? Not  on file  ?Tobacco Use  ? Smoking status: Never  ? Smokeless tobacco: Never  ?Vaping Use  ? Vaping Use: Never used  ?Substance and Sexual Activity  ? Alcohol use: Not Currently  ?  Comment: rare  ? Drug use: Never  ? Sexual activity: Not Currently  ? ? ? ?

## 2021-12-06 ENCOUNTER — Ambulatory Visit: Payer: Self-pay | Attending: Family Medicine

## 2021-12-06 ENCOUNTER — Other Ambulatory Visit: Payer: Self-pay

## 2022-01-21 ENCOUNTER — Emergency Department (HOSPITAL_COMMUNITY)
Admission: EM | Admit: 2022-01-21 | Discharge: 2022-01-21 | Disposition: A | Discharge status: Home / Self Care | Payer: No Typology Code available for payment source | Attending: Emergency Medicine | Admitting: Emergency Medicine

## 2022-01-21 ENCOUNTER — Encounter (HOSPITAL_COMMUNITY): Payer: Self-pay | Admitting: Emergency Medicine

## 2022-01-21 ENCOUNTER — Emergency Department (HOSPITAL_COMMUNITY): Payer: No Typology Code available for payment source

## 2022-01-21 DIAGNOSIS — Y92410 Unspecified street and highway as the place of occurrence of the external cause: Secondary | ICD-10-CM | POA: Diagnosis not present

## 2022-01-21 DIAGNOSIS — S40212A Abrasion of left shoulder, initial encounter: Secondary | ICD-10-CM | POA: Insufficient documentation

## 2022-01-21 DIAGNOSIS — M79602 Pain in left arm: Secondary | ICD-10-CM | POA: Diagnosis present

## 2022-01-21 DIAGNOSIS — R070 Pain in throat: Secondary | ICD-10-CM | POA: Diagnosis not present

## 2022-01-21 MED ORDER — IBUPROFEN 600 MG PO TABS
600.0000 mg | ORAL_TABLET | Freq: Four times a day (QID) | ORAL | 0 refills | Status: AC | PRN
Start: 2022-01-21 — End: ?

## 2022-01-21 MED ORDER — CYCLOBENZAPRINE HCL 10 MG PO TABS: 10.0000 mg | ORAL_TABLET | Freq: Two times a day (BID) | ORAL | 0 refills | Status: AC | PRN
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
## 2022-02-07 ENCOUNTER — Encounter: Payer: Self-pay | Admitting: Family Medicine

## 2022-02-15 ENCOUNTER — Encounter: Payer: Self-pay | Admitting: Family Medicine

## 2022-03-01 ENCOUNTER — Encounter: Payer: Self-pay | Admitting: Family Medicine

## 2022-03-15 ENCOUNTER — Encounter: Payer: Self-pay | Admitting: Family Medicine
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
## 2023-08-29 ENCOUNTER — Ambulatory Visit: Payer: Self-pay | Admitting: *Deleted

## 2023-08-29 NOTE — Telephone Encounter (Signed)
Message from Byron T sent at 08/29/2023 10:00 AM EST  Summary: medication request   Patient called stated she is experiencing upper right arm pain that radiates down her arm into her elbow and it is a constant pain. She was given a medication by a friend that worked but she does not know what the name of them are but they were 600mg . Please f/u with patient as there were no appts before Jan 8th.          Call History  Contact Date/Time Type Contact Phone/Fax User  08/29/2023 09:57 AM EST Phone (Incoming) [facility], Amanda Preston (Self) [phone] Rexene Edison) [place] [facility]   Reason for Disposition  [1] MILD pain (e.g., does not interfere with normal activities) AND [2] present > 7 days  Answer Assessment - Initial Assessment Questions 1. ONSET: "When did the pain start?"     I'm having pain in my right upper arm.    Started 2 weeks ago.   2. LOCATION: "Where is the pain located?"     Right upper arm  3. PAIN: "How bad is the pain?" (Scale 1-10; or mild, moderate, severe)   - MILD (1-3): Doesn't interfere with normal activities.   - MODERATE (4-7): Interferes with normal activities (e.g., work or school) or awakens from sleep.   - SEVERE (8-10): Excruciating pain, unable to do any normal activities, unable to hold a cup of water.     Sat. I felt a lot of pain 8/10.    I took 800 mg ibuprofen pills.   On Sun. My arm felt better.   Today it's a 5. 4. WORK OR EXERCISE: "Has there been any recent work or exercise that involved this part of the body?"     I suddenly woke up with the pain.   Every day it was stronger and now it's calmed down.   A lady gave me some pills that she was taking for the same reason.   It's better now.    The pain is always there.    I understand I'm not supposed to take other people's medicine but it really helped.   It was for inflammation.   Before I took her pills I took ibuprofen which helped also.   5. CAUSE: "What do you think is causing the arm pain?"     I  don't know 6. OTHER SYMPTOMS: "Do you have any other symptoms?" (e.g., neck pain, swelling, rash, fever, numbness, weakness)     Not asked since no injuries.   I'm using bottles of frozen water on my arm.   7. PREGNANCY: "Is there any chance you are pregnant?" "When was your last menstrual period?"     Not asked  Protocols used: Arm Pain-A-AH
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
## 2023-09-27 ENCOUNTER — Ambulatory Visit: Payer: Self-pay | Admitting: Physician Assistant

## 2023-09-27 ENCOUNTER — Ambulatory Visit: Payer: Self-pay | Attending: Physician Assistant | Admitting: Physician Assistant
